# Patient Record
Sex: Male | Born: 1985 | Race: Black or African American | Hispanic: No | Marital: Single | Smoking: Current every day smoker
Health system: Southern US, Community
[De-identification: ages and names within clinical notes are randomized; demographics above are authoritative.]

## PROBLEM LIST (undated history)

## (undated) ENCOUNTER — Ambulatory Visit (HOSPITAL_COMMUNITY): Disposition: A | Discharge status: Home / Self Care | Payer: Self-pay

## (undated) DIAGNOSIS — F1210 Cannabis abuse, uncomplicated: Secondary | ICD-10-CM

## (undated) DIAGNOSIS — K2920 Alcoholic gastritis without bleeding: Secondary | ICD-10-CM

## (undated) DIAGNOSIS — F1010 Alcohol abuse, uncomplicated: Secondary | ICD-10-CM

---

## 2010-02-08 ENCOUNTER — Emergency Department (HOSPITAL_COMMUNITY): Admission: EM | Admit: 2010-02-08 | Discharge: 2010-02-08 | Payer: Self-pay | Admitting: Emergency Medicine

## 2011-02-27 ENCOUNTER — Emergency Department (HOSPITAL_COMMUNITY)
Admission: EM | Admit: 2011-02-27 | Discharge: 2011-02-27 | Disposition: A | Discharge status: Home / Self Care | Payer: Self-pay | Attending: Emergency Medicine | Admitting: Emergency Medicine

## 2011-02-27 ENCOUNTER — Emergency Department (HOSPITAL_COMMUNITY): Payer: Self-pay

## 2011-02-27 DIAGNOSIS — S6990XA Unspecified injury of unspecified wrist, hand and finger(s), initial encounter: Secondary | ICD-10-CM | POA: Insufficient documentation

## 2011-02-27 DIAGNOSIS — M7989 Other specified soft tissue disorders: Secondary | ICD-10-CM | POA: Insufficient documentation

## 2011-02-27 DIAGNOSIS — IMO0002 Reserved for concepts with insufficient information to code with codable children: Secondary | ICD-10-CM | POA: Insufficient documentation

## 2011-02-27 DIAGNOSIS — M79609 Pain in unspecified limb: Secondary | ICD-10-CM | POA: Insufficient documentation

## 2011-02-27 DIAGNOSIS — S60229A Contusion of unspecified hand, initial encounter: Secondary | ICD-10-CM | POA: Insufficient documentation

## 2013-01-14 ENCOUNTER — Encounter (HOSPITAL_COMMUNITY): Payer: Self-pay | Admitting: Emergency Medicine

## 2013-01-14 ENCOUNTER — Emergency Department (INDEPENDENT_AMBULATORY_CARE_PROVIDER_SITE_OTHER)
Admission: EM | Admit: 2013-01-14 | Discharge: 2013-01-14 | Disposition: A | Discharge status: Home / Self Care | Payer: Self-pay | Source: Home / Self Care | Attending: Family Medicine | Admitting: Family Medicine

## 2013-01-14 ENCOUNTER — Other Ambulatory Visit (HOSPITAL_COMMUNITY)
Admission: RE | Admit: 2013-01-14 | Discharge: 2013-01-14 | Disposition: A | Discharge status: Home / Self Care | Payer: Self-pay | Source: Ambulatory Visit | Attending: Family Medicine | Admitting: Family Medicine

## 2013-01-14 DIAGNOSIS — Z113 Encounter for screening for infections with a predominantly sexual mode of transmission: Secondary | ICD-10-CM | POA: Insufficient documentation

## 2013-01-14 DIAGNOSIS — Z202 Contact with and (suspected) exposure to infections with a predominantly sexual mode of transmission: Secondary | ICD-10-CM

## 2013-01-14 DIAGNOSIS — Z2089 Contact with and (suspected) exposure to other communicable diseases: Secondary | ICD-10-CM

## 2013-01-14 LAB — POCT URINALYSIS DIP (DEVICE)
Hgb urine dipstick: NEGATIVE
Nitrite: NEGATIVE
Protein, ur: NEGATIVE mg/dL
Specific Gravity, Urine: 1.025 (ref 1.005–1.030)
Urobilinogen, UA: 2 mg/dL — ABNORMAL HIGH (ref 0.0–1.0)

## 2013-01-14 MED ORDER — LIDOCAINE HCL (PF) 1 % IJ SOLN
INTRAMUSCULAR | Status: AC
  Filled 2013-01-14: qty 5

## 2013-01-14 MED ORDER — CEFTRIAXONE SODIUM 1 G IJ SOLR
INTRAMUSCULAR | Status: AC
  Filled 2013-01-14: qty 10

## 2013-01-14 MED ORDER — AZITHROMYCIN 250 MG PO TABS
ORAL_TABLET | ORAL | Status: AC
  Filled 2013-01-14: qty 4

## 2013-01-14 MED ORDER — CEFTRIAXONE SODIUM 250 MG IJ SOLR
250.0000 mg | Freq: Once | INTRAMUSCULAR | Status: AC
  Administered 2013-01-14: 250 mg via INTRAMUSCULAR

## 2013-01-14 MED ORDER — AZITHROMYCIN 250 MG PO TABS
1000.0000 mg | ORAL_TABLET | Freq: Once | ORAL | Status: AC
  Administered 2013-01-14: 1000 mg via ORAL

## 2013-01-14 NOTE — ED Notes (Signed)
Pt c/o of his partner testing positive for Chlamydia last week. Denies burning with urination or discharge. Patient is alert and oriented.

## 2013-01-14 NOTE — ED Provider Notes (Signed)
History     CSN: [id number]  Arrival date & time 01/14/13  1135   First MD Initiated Contact with Patient 01/14/13 1311      Chief Complaint  Patient presents with  . Exposure to STD    (Consider location/radiation/quality/duration/timing/severity/associated sxs/prior treatment) HPI Comments: 27 year old male with no significant past medical history. Here concerned about exposure to sexually transmitted disease. Patient reports that he's pregnant girlfriend was diagnosed with an STD recently they think it was Chlamydia. She was treated and was asked to tell her partner to be checked. Patient denies current dysuria or peneal discharge  But states he might had these symptoms recently. After discussion he would like to be treated both for gonorrhea and chlamydia empirically if possible today.   History reviewed. No pertinent past medical history.  History reviewed. No pertinent past surgical history.  History reviewed. No pertinent family history.  History  Substance Use Topics  . Smoking status: Current Every Day Smoker -- 0.25 packs/day    Types: Cigarettes  . Smokeless tobacco: Not on file  . Alcohol Use: 1.2 oz/week    2 Cans of beer per week      Review of Systems  Constitutional: Negative for fever, chills and diaphoresis.  Genitourinary: Negative for dysuria, frequency, hematuria, flank pain, discharge and testicular pain.  Skin: Negative for rash.    Allergies  Review of patient's allergies indicates no known allergies.  Home Medications  No current outpatient prescriptions on file.  BP 134/88  Pulse 66  Temp(Src) 98.2 F (36.8 C) (Oral)  Resp 18  SpO2 99%  Physical Exam  Nursing note and vitals reviewed. Constitutional: He is oriented to person, place, and time. He appears well-developed and well-nourished. No distress.  HENT:  Head: Normocephalic and atraumatic.  Mouth/Throat: No oropharyngeal exudate.  Eyes: No scleral icterus.  Cardiovascular:  Normal heart sounds.   Pulmonary/Chest: Breath sounds normal.  Abdominal:  No CVT  Genitourinary:  Deferred genital exam.  Lymphadenopathy:    He has no cervical adenopathy.  Neurological: He is alert and oriented to person, place, and time.  Skin: No rash noted. He is not diaphoretic.    ED Course  Procedures (including critical care time)  Labs Reviewed  POCT URINALYSIS DIP (DEVICE) - Abnormal; Notable for the following:    Bilirubin Urine SMALL (*)    Ketones, ur TRACE (*)    Urobilinogen, UA 2.0 (*)    Leukocytes, UA SMALL (*)    All other components within normal limits  URINE CYTOLOGY ANCILLARY ONLY   No results found.   1. Exposure to STD       MDM  Patient was treated empirically for gonorrhea and Chlamydia was given Rocephin and 250 mg IM x1 and a 6 minus and 1 g oral x1. GC gonorrhea and trichomonas test result pending.        Sharin Grave, MD 01/16/13 [phone]

## 2013-01-19 ENCOUNTER — Telehealth (HOSPITAL_COMMUNITY): Payer: Self-pay | Admitting: *Deleted

## 2013-01-19 NOTE — ED Notes (Signed)
GC neg., Chlamydia pos., Trich neg.  Pt. adequately treated with Zithromax and Rocephin.  I called pt.  Pt. verified x 2 and given results.  Pt. told he was adequately treated.  Pt. instructed to notify his partner, no sex for 1 week and to practice safe sex. Pt. told he can get HIV testing at the [facility]. STD clinic, by appointment.  DHHS form completed and faxed to the [facility] Department. Vassie Moselle 01/19/2013

## 2016-08-13 DIAGNOSIS — K2920 Alcoholic gastritis without bleeding: Secondary | ICD-10-CM

## 2016-08-13 HISTORY — DX: Alcoholic gastritis without bleeding: K29.20

## 2016-08-16 ENCOUNTER — Encounter (HOSPITAL_COMMUNITY): Payer: Self-pay | Admitting: *Deleted

## 2016-08-16 DIAGNOSIS — K2920 Alcoholic gastritis without bleeding: Secondary | ICD-10-CM | POA: Insufficient documentation

## 2016-08-16 DIAGNOSIS — F17210 Nicotine dependence, cigarettes, uncomplicated: Secondary | ICD-10-CM | POA: Insufficient documentation

## 2016-08-16 MED ORDER — ONDANSETRON 4 MG PO TBDP
ORAL_TABLET | ORAL | Status: DC
Start: 2016-08-16 — End: 2016-08-17
  Filled 2016-08-16: qty 1

## 2016-08-16 MED ORDER — ONDANSETRON 4 MG PO TBDP
4.0000 mg | ORAL_TABLET | Freq: Once | ORAL | Status: AC | PRN
  Administered 2016-08-16: 4 mg via ORAL

## 2016-08-16 NOTE — ED Triage Notes (Signed)
Pt c/o L sided abdominal pain after vomiting onset today. Pt thinks he may have alcohol poisioning

## 2016-08-17 ENCOUNTER — Emergency Department (HOSPITAL_COMMUNITY)
Admission: EM | Admit: 2016-08-17 | Discharge: 2016-08-17 | Disposition: A | Discharge status: Home / Self Care | Payer: Self-pay | Attending: Emergency Medicine | Admitting: Emergency Medicine

## 2016-08-17 DIAGNOSIS — K2920 Alcoholic gastritis without bleeding: Secondary | ICD-10-CM

## 2016-08-17 LAB — URINE MICROSCOPIC-ADD ON: RBC / HPF: NONE SEEN RBC/hpf (ref 0–5)

## 2016-08-17 LAB — URINALYSIS, ROUTINE W REFLEX MICROSCOPIC
BILIRUBIN URINE: NEGATIVE
Glucose, UA: NEGATIVE mg/dL
Hgb urine dipstick: NEGATIVE
KETONES UR: NEGATIVE mg/dL
NITRITE: NEGATIVE
PROTEIN: 30 mg/dL — AB
Specific Gravity, Urine: 1.017 (ref 1.005–1.030)
pH: 5.5 (ref 5.0–8.0)

## 2016-08-17 LAB — COMPREHENSIVE METABOLIC PANEL
ALT: 47 U/L (ref 17–63)
ANION GAP: 14 (ref 5–15)
AST: 44 U/L — AB (ref 15–41)
Albumin: 4.6 g/dL (ref 3.5–5.0)
Alkaline Phosphatase: 62 U/L (ref 38–126)
BUN: 7 mg/dL (ref 6–20)
CHLORIDE: 103 mmol/L (ref 101–111)
CO2: 23 mmol/L (ref 22–32)
Calcium: 8.8 mg/dL — ABNORMAL LOW (ref 8.9–10.3)
Creatinine, Ser: 0.81 mg/dL (ref 0.61–1.24)
Glucose, Bld: 86 mg/dL (ref 65–99)
POTASSIUM: 3.5 mmol/L (ref 3.5–5.1)
Sodium: 140 mmol/L (ref 135–145)
TOTAL PROTEIN: 8.2 g/dL — AB (ref 6.5–8.1)
Total Bilirubin: 0.4 mg/dL (ref 0.3–1.2)

## 2016-08-17 LAB — CBC
HCT: 41.3 % (ref 39.0–52.0)
Hemoglobin: 14 g/dL (ref 13.0–17.0)
MCH: 31 pg (ref 26.0–34.0)
MCHC: 33.9 g/dL (ref 30.0–36.0)
MCV: 91.6 fL (ref 78.0–100.0)
PLATELETS: 349 10*3/uL (ref 150–400)
RBC: 4.51 MIL/uL (ref 4.22–5.81)
RDW: 14 % (ref 11.5–15.5)
WBC: 4.4 10*3/uL (ref 4.0–10.5)

## 2016-08-17 LAB — CBG MONITORING, ED: GLUCOSE-CAPILLARY: 84 mg/dL (ref 65–99)

## 2016-08-17 LAB — LIPASE, BLOOD: LIPASE: 26 U/L (ref 11–51)

## 2016-08-17 MED ORDER — PANTOPRAZOLE SODIUM 40 MG IV SOLR
INTRAVENOUS | Status: AC
  Administered 2016-08-17: 02:00:00
  Filled 2016-08-17: qty 40

## 2016-08-17 MED ORDER — OMEPRAZOLE 20 MG PO CPDR: 20.0000 mg | DELAYED_RELEASE_CAPSULE | Freq: Two times a day (BID) | ORAL | 0 refills | Status: DC

## 2016-08-17 MED ORDER — SODIUM CHLORIDE 0.9 % IV BOLUS (SEPSIS)
1000.0000 mL | Freq: Once | INTRAVENOUS | Status: AC
  Administered 2016-08-17: 1000 mL via INTRAVENOUS

## 2016-08-17 MED ORDER — PROMETHAZINE HCL 25 MG/ML IJ SOLN
12.5000 mg | Freq: Once | INTRAMUSCULAR | Status: AC
Start: 2016-08-17 — End: 2016-08-17
  Administered 2016-08-17: 12.5 mg via INTRAVENOUS
  Filled 2016-08-17: qty 1

## 2016-08-17 MED ORDER — PROMETHAZINE HCL 25 MG PO TABS: 25.0000 mg | ORAL_TABLET | Freq: Four times a day (QID) | ORAL | 0 refills | Status: DC | PRN

## 2016-08-17 NOTE — ED Notes (Signed)
Pt departed in NAD.  

## 2016-08-17 NOTE — ED Provider Notes (Signed)
[facility]-EMERGENCY DEPT Provider Note   CSN: [id number] Arrival date & time: 08/16/16  2332     History   Chief Complaint Chief Complaint  Patient presents with  . Emesis    HPI Seth Nolan is a 30 y.o. male.  Patient without significant medical history presents with nausea and vomiting today, starting around noon. He has seen a small amount of blood in his emesis. No diarrhea, fever. He complains of LUQ abdominal pain. He reports he has been drinking alcohol for the past 2-3 days and feels this may be related. No chest pain, SOB or cough.   The history is provided by the patient and a parent. No language interpreter was used.  Emesis   Associated symptoms include abdominal pain. Pertinent negatives include no chills, no diarrhea and no fever.    History reviewed. No pertinent past medical history.  There are no active problems to display for this patient.   History reviewed. No pertinent surgical history.     Home Medications    Prior to Admission medications   Not on File    Family History No family history on file.  Social History Social History  Substance Use Topics  . Smoking status: Current Every Day Smoker    Packs/day: 0.25    Types: Cigarettes  . Smokeless tobacco: Never Used  . Alcohol use 1.2 oz/week    2 Cans of beer per week     Allergies   Patient has no known allergies.   Review of Systems Review of Systems  Constitutional: Negative for chills and fever.  HENT: Negative.   Respiratory: Negative.  Negative for shortness of breath.   Cardiovascular: Negative.  Negative for chest pain.  Gastrointestinal: Positive for abdominal pain, nausea and vomiting. Negative for diarrhea.  Musculoskeletal: Negative.   Skin: Negative.   Neurological: Negative.  Negative for light-headedness.     Physical Exam Updated Vital Signs BP 131/82 (BP Location: Left Arm)   Pulse 76   Temp 97.9 F (36.6 C) (Oral)   Resp 18   SpO2 97%   Physical  Exam  Constitutional: He is oriented to person, place, and time. He appears well-developed and well-nourished.  HENT:  Head: Normocephalic.  Mouth/Throat: Mucous membranes are dry.  Neck: Normal range of motion. Neck supple.  Cardiovascular: Normal rate and regular rhythm.   No murmur heard. Pulmonary/Chest: Effort normal and breath sounds normal. He has no wheezes. He has no rales.  Abdominal: Soft. There is tenderness (Epigastric and LUQ tenderness. No guarding.). There is no rebound and no guarding.  Musculoskeletal: Normal range of motion.  Neurological: He is alert and oriented to person, place, and time.  Skin: Skin is warm and dry. No rash noted.  Psychiatric: He has a normal mood and affect.     ED Treatments / Results  Labs (all labs ordered are listed, but only abnormal results are displayed) Labs Reviewed  URINALYSIS, ROUTINE W REFLEX MICROSCOPIC (NOT AT [facility]) - Abnormal; Notable for the following:       Result Value   APPearance CLOUDY (*)    Protein, ur 30 (*)    Leukocytes, UA TRACE (*)    All other components within normal limits  URINE MICROSCOPIC-ADD ON - Abnormal; Notable for the following:    Squamous Epithelial / LPF 0-5 (*)    Bacteria, UA RARE (*)    Casts HYALINE CASTS (*)    All other components within normal limits  CBC  LIPASE, BLOOD  COMPREHENSIVE  METABOLIC PANEL  CBG MONITORING, ED    EKG  EKG Interpretation None       Radiology No results found.  Procedures Procedures (including critical care time)  Medications Ordered in ED Medications  ondansetron (ZOFRAN-ODT) 4 MG disintegrating tablet (not administered)  sodium chloride 0.9 % bolus 1,000 mL (not administered)  promethazine (PHENERGAN) injection 12.5 mg (not administered)  pantoprazole (PROTONIX) 40 MG injection (not administered)  ondansetron (ZOFRAN-ODT) disintegrating tablet 4 mg (4 mg Oral Given 08/16/16 2351)     Initial Impression / Assessment and Plan / ED Course  I  have reviewed the triage vital signs and the nursing notes.  Pertinent labs & imaging results that were available during my care of the patient were reviewed by me and considered in my medical decision making (see chart for details).  Clinical Course     Patient presents after 2-3 days heavy alcohol use with nausea and vomiting. He continues have vomiting in the ED despite Zofran.  IVF's bolused, IV Phenergan provided. He is resting on re-evaluation without further nausea. Normal hemoglobin.   Lab delay due to failed chemistry spectrometer. Labs being sent out. Will continue to observe.  Labs reassuring. Negative for evidence of pancreatitis. Feel symptoms are due to alcohol gastritis without hemorrhage. Will start on Prilosec, and provide resources for alcohol abuse counseling.  Final Clinical Impressions(s) / ED Diagnoses   Final diagnoses:  None   1. Alcohol gastritis   New Prescriptions New Prescriptions   No medications on file     Elpidio AnisShari Liston Thum, PA-C 08/17/16 0355    Gilda Creasehristopher J Pollina, MD 08/17/16 [phone]

## 2016-08-17 NOTE — ED Notes (Signed)
2nd NS bolus completed. Pt sleeping, but family at bedside reports no additional episodes of emesis since receiving medication. Pt asked for and was given water to drink.

## 2017-07-04 ENCOUNTER — Encounter (HOSPITAL_COMMUNITY): Payer: Self-pay | Admitting: Emergency Medicine

## 2017-07-04 ENCOUNTER — Emergency Department (HOSPITAL_COMMUNITY)
Admission: EM | Admit: 2017-07-04 | Discharge: 2017-07-06 | Disposition: A | Discharge status: Other Acute Inpatient Hospital | Payer: Self-pay | Attending: Emergency Medicine | Admitting: Emergency Medicine

## 2017-07-04 DIAGNOSIS — F17210 Nicotine dependence, cigarettes, uncomplicated: Secondary | ICD-10-CM | POA: Insufficient documentation

## 2017-07-04 DIAGNOSIS — F1210 Cannabis abuse, uncomplicated: Secondary | ICD-10-CM | POA: Insufficient documentation

## 2017-07-04 DIAGNOSIS — F10920 Alcohol use, unspecified with intoxication, uncomplicated: Secondary | ICD-10-CM | POA: Insufficient documentation

## 2017-07-04 DIAGNOSIS — F333 Major depressive disorder, recurrent, severe with psychotic symptoms: Secondary | ICD-10-CM | POA: Insufficient documentation

## 2017-07-04 DIAGNOSIS — R45851 Suicidal ideations: Secondary | ICD-10-CM | POA: Insufficient documentation

## 2017-07-04 HISTORY — DX: Cannabis abuse, uncomplicated: F12.10

## 2017-07-04 HISTORY — DX: Alcoholic gastritis without bleeding: K29.20

## 2017-07-04 HISTORY — DX: Alcohol abuse, uncomplicated: F10.10

## 2017-07-04 LAB — CBC
HEMATOCRIT: 41.8 % (ref 39.0–52.0)
HEMOGLOBIN: 14 g/dL (ref 13.0–17.0)
MCH: 31.3 pg (ref 26.0–34.0)
MCHC: 33.5 g/dL (ref 30.0–36.0)
MCV: 93.3 fL (ref 78.0–100.0)
Platelets: 318 10*3/uL (ref 150–400)
RBC: 4.48 MIL/uL (ref 4.22–5.81)
RDW: 13.4 % (ref 11.5–15.5)
WBC: 5.1 10*3/uL (ref 4.0–10.5)

## 2017-07-04 LAB — RAPID URINE DRUG SCREEN, HOSP PERFORMED
Amphetamines: NOT DETECTED
BARBITURATES: NOT DETECTED
Benzodiazepines: NOT DETECTED
Cocaine: NOT DETECTED
Opiates: NOT DETECTED
TETRAHYDROCANNABINOL: POSITIVE — AB

## 2017-07-04 LAB — COMPREHENSIVE METABOLIC PANEL
ALBUMIN: 4.5 g/dL (ref 3.5–5.0)
ALK PHOS: 54 U/L (ref 38–126)
ALT: 15 U/L — AB (ref 17–63)
AST: 26 U/L (ref 15–41)
Anion gap: 11 (ref 5–15)
BUN: 7 mg/dL (ref 6–20)
CALCIUM: 8.7 mg/dL — AB (ref 8.9–10.3)
CHLORIDE: 107 mmol/L (ref 101–111)
CO2: 24 mmol/L (ref 22–32)
CREATININE: 0.99 mg/dL (ref 0.61–1.24)
GFR calc Af Amer: >60 mL/min (ref >60)
GFR calc non Af Amer: >60 mL/min (ref >60)
GLUCOSE: 92 mg/dL (ref 65–99)
Potassium: 3.5 mmol/L (ref 3.5–5.1)
SODIUM: 142 mmol/L (ref 135–145)
Total Bilirubin: 0.5 mg/dL (ref 0.3–1.2)
Total Protein: 7.7 g/dL (ref 6.5–8.1)

## 2017-07-04 LAB — ACETAMINOPHEN LEVEL

## 2017-07-04 LAB — ETHANOL: Alcohol, Ethyl (B): 393 mg/dL (ref <5)

## 2017-07-04 LAB — SALICYLATE LEVEL: Salicylate Lvl: <7 mg/dL (ref 2.8–30.0)

## 2017-07-04 MED ORDER — THIAMINE HCL 100 MG/ML IJ SOLN: 100.0000 mg | Freq: Every day | INTRAMUSCULAR | Status: DC

## 2017-07-04 MED ORDER — LORAZEPAM 1 MG PO TABS
0.0000 mg | ORAL_TABLET | Freq: Four times a day (QID) | ORAL | Status: DC
  Administered 2017-07-05 (×2): 1 mg via ORAL
  Filled 2017-07-04 (×2): qty 1

## 2017-07-04 MED ORDER — LORAZEPAM 1 MG PO TABS: 0.0000 mg | ORAL_TABLET | Freq: Two times a day (BID) | ORAL | Status: DC

## 2017-07-04 MED ORDER — LORAZEPAM 2 MG/ML IJ SOLN: 0.0000 mg | Freq: Four times a day (QID) | INTRAMUSCULAR | Status: DC

## 2017-07-04 MED ORDER — VITAMIN B-1 100 MG PO TABS
100.0000 mg | ORAL_TABLET | Freq: Every day | ORAL | Status: DC
  Administered 2017-07-05 – 2017-07-06 (×2): 100 mg via ORAL
  Filled 2017-07-04 (×2): qty 1

## 2017-07-04 MED ORDER — LORAZEPAM 2 MG/ML IJ SOLN: 0.0000 mg | Freq: Two times a day (BID) | INTRAMUSCULAR | Status: DC

## 2017-07-04 NOTE — ED Provider Notes (Signed)
[facility] Provider Note   CSN: [id number] Arrival date & time: 07/04/17  1948    History   Chief Complaint Chief Complaint  Patient presents with  . Suicidal    HPI Seth Nolan is a 31 y.o. male.  31 year old male with no significant past medical history presents to the emergency department for suicidal ideations. Patient states he has been having plans of killing himself by drinking himself to death. He states that he has been depressed, "going through it". He has been advised to seek assistance from a counselor or therapist in the past. He denies a history of behavioral health hospitalizations. He is not currently on psychiatric medication. He states that his girlfriend left and took his daughter which prompted his worsening depression. He has been drinking alcohol today, binge drinking over the last few days. No history of seizures from alcohol withdrawal. Only illicit drug use is significant for marijuana. No homicidal ideations.   The history is provided by the patient. No language interpreter was used.    History reviewed. No pertinent past medical history.  There are no active problems to display for this patient.   History reviewed. No pertinent surgical history.     Home Medications    Prior to Admission medications   Medication Sig Start Date End Date Taking? Authorizing Provider  omeprazole (PRILOSEC) 20 MG capsule Take 1 capsule (20 mg total) by mouth 2 (two) times daily before a meal. Patient not taking: Reported on 07/04/2017 08/17/16   Elpidio Anis, PA-C  promethazine (PHENERGAN) 25 MG tablet Take 1 tablet (25 mg total) by mouth every 6 (six) hours as needed for nausea or vomiting. Patient not taking: Reported on 07/04/2017 08/17/16   Elpidio Anis, PA-C    Family History No family history on file.  Social History Social History  Substance Use Topics  . Smoking status: Current Every Day Smoker    Packs/day: 0.25    Types: Cigarettes  .  Smokeless tobacco: Never Used  . Alcohol use 1.2 oz/week    2 Cans of beer per week     Allergies   Patient has no known allergies.   Review of Systems Review of Systems Ten systems reviewed and are negative for acute change, except as noted in the HPI.    Physical Exam Updated Vital Signs BP 124/66   Pulse 74   Temp 98.2 F (36.8 C) (Oral)   Resp 16   SpO2 98%   Physical Exam  Constitutional: He is oriented to person, place, and time. He appears well-developed and well-nourished. No distress.  Nontoxic and in NAD  HENT:  Head: Normocephalic and atraumatic.  Eyes: Conjunctivae and EOM are normal. No scleral icterus.  Neck: Normal range of motion.  Cardiovascular: Normal rate, regular rhythm and intact distal pulses.   Pulmonary/Chest: Effort normal. No respiratory distress.  Respirations even and unlabored. Lungs CTAB.  Musculoskeletal: Normal range of motion.  Neurological: He is alert and oriented to person, place, and time. He exhibits normal muscle tone. Coordination normal.  GCS 15. Speech mildly slurred, but goal oriented with logical thoughts. Patient ambulatory with steady gait.  Skin: Skin is warm and dry. No rash noted. He is not diaphoretic. No erythema. No pallor.  Psychiatric: His behavior is normal. His speech is slurred (minimal). He exhibits a depressed mood. He expresses suicidal ideation. He expresses no homicidal ideation. He expresses no homicidal plans.  Nursing note and vitals reviewed.    ED Treatments / Results  Labs (  all labs ordered are listed, but only abnormal results are displayed) Labs Reviewed  COMPREHENSIVE METABOLIC PANEL - Abnormal; Notable for the following:       Result Value   Calcium 8.7 (*)    ALT 15 (*)    All other components within normal limits  ETHANOL - Abnormal; Notable for the following:    Alcohol, Ethyl (B) 393 (*)    All other components within normal limits  ACETAMINOPHEN LEVEL - Abnormal; Notable for the  following:    Acetaminophen (Tylenol), Serum <10 (*)    All other components within normal limits  RAPID URINE DRUG SCREEN, HOSP PERFORMED - Abnormal; Notable for the following:    Tetrahydrocannabinol POSITIVE (*)    All other components within normal limits  SALICYLATE LEVEL  CBC    EKG  EKG Interpretation None       Radiology No results found.  Procedures Procedures (including critical care time)  Medications Ordered in ED Medications  LORazepam (ATIVAN) injection 0-4 mg (0 mg Intravenous Not Given 07/05/17 0122)    Or  LORazepam (ATIVAN) tablet 0-4 mg ( Oral See Alternative 07/05/17 0122)  LORazepam (ATIVAN) injection 0-4 mg (not administered)    Or  LORazepam (ATIVAN) tablet 0-4 mg (not administered)  thiamine (VITAMIN B-1) tablet 100 mg (not administered)    Or  thiamine (B-1) injection 100 mg (not administered)     Initial Impression / Assessment and Plan / ED Course  I have reviewed the triage vital signs and the nursing notes.  Pertinent labs & imaging results that were available during my care of the patient were reviewed by me and considered in my medical decision making (see chart for details).     Patient presenting for suicidal ideations. He was intoxicated on arrival. The patient has been medically cleared and evaluated by TTS. They recommend further inpatient treatment for major depressive disorder. No beds available at behavioral health. TTS to seek placement.   Final Clinical Impressions(s) / ED Diagnoses   Final diagnoses:  Severe episode of recurrent major depressive disorder, with psychotic features (HCC)  Alcoholic intoxication without complication [facility])    New Prescriptions New Prescriptions   No medications on file     Antony Madura, PA-C 07/05/17 0539    Vanetta Mulders, MD 07/08/17 [phone]

## 2017-07-04 NOTE — ED Triage Notes (Signed)
Pt tearful in triage, states he is depressed, "going through it", but needs help. States he is feeling suicidal, no plan at this time. PT states his girlfriend left and took his daughter and he "doesn't want to go through it anymore". States he has been drinking alcohol today.

## 2017-07-04 NOTE — ED Notes (Signed)
Staffing office notified for sitter need.

## 2017-07-05 ENCOUNTER — Encounter (HOSPITAL_COMMUNITY): Payer: Self-pay | Admitting: *Deleted

## 2017-07-05 NOTE — ED Notes (Signed)
Patient does not have a sitter at this time as staffing nor the ED can cover.  Pt is in room F11, which has a door and a window, each with blinds.  Door is shut to block out light for patient comfort.  Window facing nurses' station has blinds open for patient observation.

## 2017-07-05 NOTE — ED Notes (Signed)
Pt aware meal tray on bedside table. States will eat later.

## 2017-07-05 NOTE — [facility] Counselor (Signed)
Per Nira Conn, NP: Patient meets criteria for inpatient treatment.  Per [facility] for [facility], Binnie Rail, RN, no appropriate beds available at this time.   Attending Provider, Antony Madura, PA-C, notified at [phone].

## 2017-07-05 NOTE — ED Notes (Signed)
Woke pt to advise breakfast tray on bedside table - pt acknowledged and then returned to sleeping.

## 2017-07-05 NOTE — Progress Notes (Signed)
Patient has been recommended inpatient treatment and referred to the following facilities: [facility], [facility], [facility], [facility], and [facility].  Patient is being reviewed at [facility].  CSW in disposition will continue to follow up with placement efforts.  Melbourne Abts, MSW, LCSWA Clinical social worker in disposition [facility], TTS Office [phone] and [phone] 07/05/2017 3:57 PM
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
## 2017-07-05 NOTE — ED Notes (Signed)
States "I feel OK right now. I don't think I need any medicine".

## 2017-07-05 NOTE — ED Notes (Signed)
Sitter arrived

## 2017-07-05 NOTE — Progress Notes (Signed)
Patient is being reviewed at [facility]. Please re-check patient's new vitals and CIWA, per [facility] at [facility]. [facility] RN Ledon Snare was informed.  Melbourne Abts, MSW, LCSWA Clinical social worker in disposition [facility], TTS Office

## 2017-07-05 NOTE — ED Notes (Signed)
Plan of care discussed with patient.  Patient's mom also aware.  Patient's mom TOOK ALL OF PATIENT'S BELONGINGS (except his sandals) with her.  Patient resting in bed, lights dimmed for comfort.

## 2017-07-05 NOTE — ED Notes (Signed)
Asked patient if he was experiencing any pain.  Pt states "yeah, in my head".  This RN asked him to rate it 0-10.  Patient states "there's no pain skill for this kind of pain".  This RN asked him to clarify.  Pt states  "I can hear him talking, and it hurts my head".  This RN clarified with patient and he states he hears a voice that "I just try to ignore".  Denies command hallucination.  When asked what would help this kind of headache patient states "Do you want the truth?  A ton of alcohol".  This RN reassured him and at that time the TTS machine rang for patient assessment.

## 2017-07-05 NOTE — ED Notes (Signed)
Pt states he is not hungry - only thirsty. Apple juice x 4 given as requested.

## 2017-07-06 ENCOUNTER — Inpatient Hospital Stay (HOSPITAL_COMMUNITY)
Admission: AD | Admit: 2017-07-06 | Discharge: 2017-07-10 | DRG: 897 | Disposition: A | Discharge status: Home / Self Care | Payer: Federal, State, Local not specified - Other | Source: Intra-hospital | Attending: Psychiatry | Admitting: Psychiatry

## 2017-07-06 ENCOUNTER — Encounter (HOSPITAL_COMMUNITY): Payer: Self-pay | Admitting: *Deleted

## 2017-07-06 DIAGNOSIS — Y908 Blood alcohol level of 240 mg/100 ml or more: Secondary | ICD-10-CM | POA: Diagnosis present

## 2017-07-06 DIAGNOSIS — K2920 Alcoholic gastritis without bleeding: Secondary | ICD-10-CM | POA: Diagnosis present

## 2017-07-06 DIAGNOSIS — F17210 Nicotine dependence, cigarettes, uncomplicated: Secondary | ICD-10-CM | POA: Diagnosis not present

## 2017-07-06 DIAGNOSIS — F419 Anxiety disorder, unspecified: Secondary | ICD-10-CM | POA: Diagnosis present

## 2017-07-06 DIAGNOSIS — G4700 Insomnia, unspecified: Secondary | ICD-10-CM | POA: Diagnosis present

## 2017-07-06 DIAGNOSIS — F10230 Alcohol dependence with withdrawal, uncomplicated: Secondary | ICD-10-CM | POA: Diagnosis present

## 2017-07-06 DIAGNOSIS — F10220 Alcohol dependence with intoxication, uncomplicated: Secondary | ICD-10-CM | POA: Diagnosis present

## 2017-07-06 DIAGNOSIS — F12120 Cannabis abuse with intoxication, uncomplicated: Secondary | ICD-10-CM | POA: Diagnosis present

## 2017-07-06 DIAGNOSIS — F333 Major depressive disorder, recurrent, severe with psychotic symptoms: Secondary | ICD-10-CM | POA: Diagnosis present

## 2017-07-06 DIAGNOSIS — R45850 Homicidal ideations: Secondary | ICD-10-CM | POA: Diagnosis not present

## 2017-07-06 DIAGNOSIS — F1994 Other psychoactive substance use, unspecified with psychoactive substance-induced mood disorder: Principal | ICD-10-CM

## 2017-07-06 DIAGNOSIS — Z79899 Other long term (current) drug therapy: Secondary | ICD-10-CM

## 2017-07-06 DIAGNOSIS — R45851 Suicidal ideations: Secondary | ICD-10-CM | POA: Diagnosis not present

## 2017-07-06 MED ORDER — ALUM & MAG HYDROXIDE-SIMETH 200-200-20 MG/5ML PO SUSP: 30.0000 mL | ORAL | Status: DC | PRN

## 2017-07-06 MED ORDER — TRAZODONE HCL 50 MG PO TABS
50.0000 mg | ORAL_TABLET | Freq: Every evening | ORAL | Status: DC | PRN
  Administered 2017-07-06 – 2017-07-09 (×4): 50 mg via ORAL
  Filled 2017-07-06 (×2): qty 1
  Filled 2017-07-06: qty 7
  Filled 2017-07-06 (×2): qty 1

## 2017-07-06 MED ORDER — MAGNESIUM HYDROXIDE 400 MG/5ML PO SUSP: 30.0000 mL | Freq: Every day | ORAL | Status: DC | PRN

## 2017-07-06 MED ORDER — ACETAMINOPHEN 325 MG PO TABS: 650.0000 mg | ORAL_TABLET | Freq: Four times a day (QID) | ORAL | Status: DC | PRN

## 2017-07-06 MED ORDER — HYDROXYZINE HCL 25 MG PO TABS
25.0000 mg | ORAL_TABLET | Freq: Four times a day (QID) | ORAL | Status: DC | PRN
  Administered 2017-07-08 – 2017-07-09 (×2): 25 mg via ORAL
  Filled 2017-07-06 (×2): qty 1
  Filled 2017-07-06: qty 10

## 2017-07-06 NOTE — Tx Team (Signed)
Initial Treatment Plan 07/06/2017 4:30 PM Seth Nolan ZOX:[id number]    PATIENT STRESSORS: Financial difficulties Marital or family conflict Substance abuse   PATIENT STRENGTHS: Ability for insight Average or above average intelligence Communication skills Motivation for treatment/growth   PATIENT IDENTIFIED PROBLEMS: At risk for suicide  Substance Abuse  "Control my habits/mood swings"  "control my drinking"               DISCHARGE CRITERIA:  Ability to meet basic life and health needs Improved stabilization in mood, thinking, and/or behavior Motivation to continue treatment in a less acute level of care Need for constant or close observation no longer present Withdrawal symptoms are absent or subacute and managed without 24-hour nursing intervention  PRELIMINARY DISCHARGE PLAN: Outpatient therapy Return to previous living arrangement Return to previous work or school arrangements  PATIENT/FAMILY INVOLVEMENT: This treatment plan has been presented to and reviewed with the patient, Seth Nolan.  The patient and family have been given the opportunity to ask questions and make suggestions.  Carleene Overlie, RN 07/06/2017, 4:30 PM

## 2017-07-06 NOTE — Progress Notes (Signed)
Per Berneice Heinrich , [facility], patient has been accepted to [facility], bed 300-2 ; Accepting provider is Nira Conn, NP; Attending provider is Dr. Jama Flavors.   Patient can arrive at now, bed is ready. Number for report is [phone].    Ermalinda Memos, RN notified.   Baldo Daub MSW, LCSWA CSW Disposition [phone]

## 2017-07-06 NOTE — ED Notes (Signed)
Pt at desk on phone. Callm and cooperative.

## 2017-07-06 NOTE — Progress Notes (Signed)
Patient denied SI and HI, contracts for safety.  Denied A/V hallucinations.  Denied pain.  Patient stated he did not need any anxiety/pain medications.  Patient went for recreation activity this afternoon. Respirations even and unlabored.   No signs/symptoms of pain/distress noted on patient's face/body movements.  Safety maintained with 15 minute checks.

## 2017-07-06 NOTE — ED Notes (Signed)
Visitors at bedside.

## 2017-07-06 NOTE — [facility] Group Notes (Signed)
[facility] Group Therapy 07/06/2017 1:15pm  Type of Therapy: Mental Health Association Presentation  Participation Level: Active  Participation Quality: Attentive  Affect: Appropriate  Cognitive: Oriented  Insight: Developing/Improving  Engagement in Therapy: Engaged  Modes of Intervention: Discussion, Education and Socialization  Summary of Progress/Problems: Mental Health Association (MHA) Speaker came to talk about his personal journey with mental health. The pt processed ways by which to relate to the speaker. MHA speaker provided handouts and educational information pertaining to groups and services offered by the MHA. Pt was engaged in speaker's presentation and was receptive to resources provided.    Seth Nolan N Smart, LCSW 07/06/2017 2:10 PM  

## 2017-07-06 NOTE — Progress Notes (Signed)
Tech/sitter walked with Pt to Pod C (shower). Shower is occupied at this time, will try again.

## 2017-07-06 NOTE — ED Notes (Signed)
[facility] contacted for transport 

## 2017-07-06 NOTE — Progress Notes (Signed)
Admission Note:  31 year old male who presents voluntary, in no acute distress, for the treatment of SI and Substance Abuse. Patient reports "I'm here because I drink a lot. I go through my drinking spells".  Patient reports daily alcohol use with "blacking out".  Patient appears anxious and depressed with withdrawal complaints of "sweating and shakes". Patient was calm and cooperative with admission process. Patient currently denies SI and contracts for safety upon admission. Patient reports, prior to admission, SI with a plan to "drink myself to death".  Patient reports AVH stating "A little man talks to me sometimes. I actually hear him a lot".  Patient identifies stressors as "Not where I'm suppose to be physically and mentally" and "relationship issues".  Patient reports increased anger with alcohol use.  Patient currently lives with his parents, girlfriend, and 18 year old daughter.  While at [facility], patient would like to "control my habits and mood swings" and "Control my drinking". Skin was assessed and found to be clear of any abnormal marks apart from multiple tattoos. Patient searched and no contraband found, POC and unit policies explained and understanding verbalized. Consents obtained. Food and fluids offered and accepted. Patient had no additional questions or concerns.

## 2017-07-07 DIAGNOSIS — Z653 Problems related to other legal circumstances: Secondary | ICD-10-CM

## 2017-07-07 DIAGNOSIS — F1290 Cannabis use, unspecified, uncomplicated: Secondary | ICD-10-CM

## 2017-07-07 DIAGNOSIS — F17210 Nicotine dependence, cigarettes, uncomplicated: Secondary | ICD-10-CM

## 2017-07-07 DIAGNOSIS — Z6379 Other stressful life events affecting family and household: Secondary | ICD-10-CM

## 2017-07-07 DIAGNOSIS — R45851 Suicidal ideations: Secondary | ICD-10-CM

## 2017-07-07 DIAGNOSIS — F1994 Other psychoactive substance use, unspecified with psychoactive substance-induced mood disorder: Principal | ICD-10-CM

## 2017-07-07 DIAGNOSIS — R45850 Homicidal ideations: Secondary | ICD-10-CM

## 2017-07-07 MED ORDER — NALTREXONE HCL 50 MG PO TABS
50.0000 mg | ORAL_TABLET | Freq: Every day | ORAL | Status: DC
  Administered 2017-07-07 – 2017-07-10 (×4): 50 mg via ORAL
  Filled 2017-07-07 (×2): qty 7
  Filled 2017-07-07 (×5): qty 1

## 2017-07-07 MED ORDER — VITAMIN B-1 100 MG PO TABS
100.0000 mg | ORAL_TABLET | Freq: Every day | ORAL | Status: DC
  Administered 2017-07-07 – 2017-07-10 (×4): 100 mg via ORAL
  Filled 2017-07-07 (×6): qty 1

## 2017-07-07 MED ORDER — FOLIC ACID 1 MG PO TABS
1.0000 mg | ORAL_TABLET | Freq: Every day | ORAL | Status: DC
  Administered 2017-07-07 – 2017-07-10 (×4): 1 mg via ORAL
  Filled 2017-07-07 (×7): qty 1

## 2017-07-07 MED ORDER — NICOTINE 21 MG/24HR TD PT24
21.0000 mg | MEDICATED_PATCH | Freq: Every day | TRANSDERMAL | Status: DC
  Administered 2017-07-07 – 2017-07-08 (×2): 21 mg via TRANSDERMAL
  Filled 2017-07-07 (×5): qty 1

## 2017-07-07 MED ORDER — CHLORDIAZEPOXIDE HCL 25 MG PO CAPS: 25.0000 mg | ORAL_CAPSULE | Freq: Three times a day (TID) | ORAL | Status: DC | PRN

## 2017-07-07 NOTE — [facility] Suicide Risk Assessment (Signed)
[facility] Admission Suicide Risk Assessment   Nursing information obtained from:  Patient Demographic factors:  Male, Access to firearms Current Mental Status:  Suicidal ideation indicated by patient, Suicide plan, Self-harm thoughts, Self-harm behaviors Loss Factors:  Loss of significant relationship, Financial problems / change in socioeconomic status Historical Factors:  Family history of mental illness or substance abuse Risk Reduction Factors:  Responsible for children under 81 years of age, Sense of responsibility to family, Employed, Living with another person, especially a relative, Positive social support  Total Time spent with patient: 30 minutes Principal Problem: Substance induced mood disorder (HCC) Diagnosis:   Patient Active Problem List   Diagnosis Date Noted  . 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 MDD (major depressive disorder), recurrent, severe, with psychosis (HCC) [F33.3] 07/06/2017   Subjective Data:  31 y.o AAM single, employed, lives with his mom and sisters. Brought to the ER by his mom. Intoxicated with alcohol and THC. BAL was 393 mg/dl. Expressed suicidal thoughts and thoughts of hurting others. Reported to have expressed thoughts of drinking himself to death. Reported to have expressed rage towards anyone who gets into his way. Routine labs was essentially normal. Suicidal and homicidal thoughts resolved when he sobered up. No evidence of depression, psychosis or mania. No past suicidal behavior. No past history of violent behavior. No family history of mental illness or suicide. Patient has some insight into his addiction. He is cooperate with care. He is processing effects his addiction could have over the years. Future oriented. Not confused. Clear minded and motivated to get better.  Continued Clinical Symptoms:  Alcohol Use Disorder Identification Test Final Score (AUDIT): 39 The "Alcohol Use Disorders Identification Test", Guidelines for  Use in Primary Care, Second Edition.  World Science writer [facility]). Score between 0-7:  no or low risk or alcohol related problems. Score between 8-15:  moderate risk of alcohol related problems. Score between 16-19:  high risk of alcohol related problems. Score 20 or above:  warrants further diagnostic evaluation for alcohol dependence and treatment.   CLINICAL FACTORS:   Alcohol/Substance Abuse/Dependencies   Musculoskeletal: Strength & Muscle Tone: within normal limits Gait & Station: normal Patient leans: N/A  Psychiatric Specialty Exam: Physical Exam  ROS  Blood pressure 129/86, pulse 68, temperature 98.3 F (36.8 C), temperature source Oral, resp. rate 16, height  (1.88 m), weight 72.6 kg (160 lb).Body mass index is 20.54 kg/m.  General Appearance: As in H&P  Eye Contact:  As in H&P  Speech:  As in H&P  Volume:  As in H&P  Mood:  As in H&P  Affect:  As in H&P  Thought Process:  As in H&P  Orientation:  As in H&P  Thought Content:  As in H&P  Suicidal Thoughts:  As in H&P  Homicidal Thoughts:  As in H&P  Memory:  As in H&P  Judgement:  As in H&P  Insight:  As in H&P  Psychomotor Activity:  As in H&P  Concentration:  As in H&P  Recall:  As in H&P  Fund of Knowledge:  As in H&P  Language:  As in H&P  Akathisia:  As in H&P  Handed:  As in H&P  AIMS (if indicated):     Assets:  As in H&P  ADL's:  As in H&P  Cognition:  As in H&P  Sleep:  Number of Hours: 5.75      COGNITIVE FEATURES THAT CONTRIBUTE TO RISK:  None    SUICIDE  RISK:   Minimal: No identifiable suicidal ideation.  Patients presenting with no risk factors but with morbid ruminations; may be classified as minimal risk based on the severity of the depressive symptoms  PLAN OF CARE:  As in H&P  I certify that inpatient services furnished can reasonably be expected to improve the patient's condition.   Georgiann Cocker, MD 07/07/2017, 11:55 AM

## 2017-07-07 NOTE — Progress Notes (Signed)
D: Patient endorses passive SI but contracts for safety and denies any HI or AVH at this time.   Patient has a pleasant mood during interaction with a flat affect.  Pt. States that this is his first admission and is, "just trying to get used to everything".  Pt. Is visualized in the dayroom interacting with staff and others on the unit.  Pt. Denies any physical complaints tonight and is inquisitive about the process and length of stay.  Pt. Took medication to assist with sleep tonight.  A: Patient given emotional support from RN. Patient encouraged to come to staff with concerns and/or questions. Patient's medication routine continued. Patient's orders and plan of care reviewed.   R: Patient remains appropriate and cooperative. Will continue to monitor patient q15 minutes for safety.

## 2017-07-07 NOTE — [facility] Counselor (Signed)
Adult Comprehensive Assessment  Patient ID: Seth Nolan, male   DOB: 03-Feb-1986, 31 y.o.   MRN: [id number]  Information Source: Information source: Patient  Current Stressors:  Educational / Learning stressors: High school education  Employment / Job issues: Pt is concerned that he may lose his job since he is in the hospital  Family Relationships: Distant relationship with his brother  Surveyor, quantity / Lack of resources (include bankruptcy): Limited income  Housing / Lack of housing: None reported- pt lives with his parents  Physical health (include injuries & life threatening diseases): None reported  Social relationships: None reported  Substance abuse: Daily alcohol use  Bereavement / Loss: None reported   Living/Environment/Situation:  Living Arrangements: Parent, Other relatives Living conditions (as described by patient or guardian): Pt lives with his parents, his girlfriend, his daughter, and his sister  How long has patient lived in current situation?: About 10 years  What is atmosphere in current home: Comfortable  Family History:  Marital status: Long term relationship Long term relationship, how long?: 7 years  What types of issues is patient dealing with in the relationship?: Pt's drinking has caused a lot of issues in the relationship  Does patient have children?: Yes How many children?: 1 (17 yo daughter ) How is patient's relationship with their children?: Pt reports having a good relaitonship with his daughter   Childhood History:  By whom was/is the patient raised?: Both parents Additional childhood history information: Pt reports having a good childhood. Pt states that his father also had a drinking problem while pt was growing up. Description of patient's relationship with caregiver when they were a child: Good relationship with his parents as a child  Patient's description of current relationship with people who raised him/her: Pt reports having a close  relationship with his parents currently  How were you disciplined when you got in trouble as a child/adolescent?: "I never really got into trouble" Does patient have siblings?: Yes Number of Siblings: 3 (2 sisters, 1 brother ) Description of patient's current relationship with siblings: Pt is close with his sisters but has a distant relationship with his brother  Did patient suffer any verbal/emotional/physical/sexual abuse as a child?: No Did patient suffer from severe childhood neglect?: No Has patient ever been sexually abused/assaulted/raped as an adolescent or adult?: No Was the patient ever a victim of a crime or a disaster?: No Witnessed domestic violence?: No Has patient been effected by domestic violence as an adult?: No  Education:  Highest grade of school patient has completed: High school graduate  Currently a student?: No Learning disability?: No  Employment/Work Situation:   Employment situation: Employed Where is patient currently employed?: [employer]  How long has patient been employed?: About a month  Patient's job has been impacted by current illness: Yes Describe how patient's job has been impacted: Pt is missing time from work due to being in the hospital  What is the longest time patient has a held a job?: 3 years  Where was the patient employed at that time?: [employer]  Has patient ever been in the [employer]?: No Has patient ever served in combat?: No Did You Receive Any Psychiatric Treatment/Services While in Equities trader?:  (NA) Are There Guns or Other Weapons in Your Home?: Yes Types of Guns/Weapons: Manufacturing systems engineer, hand guns  Are These Weapons Safely Secured?: Yes (The guns are in a safe and pt does not know the code )  Financial Resources:   Financial resources: Support  from parents / caregiver, Income from employment  Alcohol/Substance Abuse:   What has been your use of drugs/alcohol within the last 12 months?: Daily alcohol use, daily THC use   If attempted suicide, did drugs/alcohol play a role in this?: No Alcohol/Substance Abuse Treatment Hx: Denies past history Has alcohol/substance abuse ever caused legal problems?: Yes (Pending DWI charge )  Social Support System:   Patient's Community Support System: Good Describe Community Support System: Mom, dad, sister, girlfriend, daughter  Type of faith/religion: [employer]  How does patient's faith help to cope with current illness?: Pt isn't curently going to church   Leisure/Recreation:   Leisure and Hobbies: Therapist, music, going to the gym  Strengths/Needs:   What things does the patient do well?: "I don't know" In what areas does patient struggle / problems for patient: Alcohol use, being more social, spending more time with his family   Discharge Plan:   Does patient have access to transportation?: Yes (Pt's girlfriend or dad will pick him up) Will patient be returning to same living situation after discharge?: Yes Currently receiving community mental health services: No If no, would patient like referral for services when discharged?: Yes (What county?) [facility] outpatient [facility] referral needed ) Does patient have financial barriers related to discharge medications?: Yes Patient description of barriers related to discharge medications: Limited income   Summary/Recommendations:     Patient is a 31 yo male who presented to the hospital with depression and substance use. Primary triggers for admission include increased substance use. Pt reports drinking daily and states that his drinking is putting a strain on his family relationships and the relationship with his girlfriend. During the time of the assessment pt was alert and oriented, pleasant, and forthcoming with information. Pt is agreeable to continuing treatment on an outpatient basis. Pt's supports include his family and his girlfriend. Patient will benefit from crisis stabilization, medication evaluation, group therapy and  pyschoeducation, in addition to case management for discharge planning. At discharge, it is recommended that pt remain compliant with the established discharge plan and continue treatment.   Jonathon Jordan, MSW, [facility] 07/07/2017

## 2017-07-07 NOTE — Progress Notes (Signed)
D: Patient denies SI, HI or AVH. Patient has a pleasant mood with anxious/flat affect.  Pt. States that he has had a good day, appetite is good and reports he attended two groups.  Pt. Reports that he met with social worker and doctor today and is starting to develop a plan for discharge which is to return to his current living situation.  Pt. Has been visible on the unit interacting with staff and others.  A: Patient given emotional support from RN. Patient encouraged to come to staff with concerns and/or questions. Patient's medication routine continued. Patient's orders and plan of care reviewed.   R: Patient remains appropriate and cooperative. Will continue to monitor patient q15 minutes for safety.   

## 2017-07-07 NOTE — Plan of Care (Signed)
Problem: Education: Goal: Mental status will improve Outcome: Progressing Nurse discussed depression/anxiety/coping skills with patient.    

## 2017-07-07 NOTE — Progress Notes (Signed)
Adult Psychoeducational Group Note  Date:  07/07/2017 Time:  2:56 AM  Group Topic/Focus:  Wrap-Up Group:   The focus of this group is to help patients review their daily goal of treatment and discuss progress on daily workbooks.  Participation Level:  Active  Participation Quality:  Appropriate  Affect:  Appropriate  Cognitive:  Appropriate  Insight: Appropriate  Engagement in Group:  Engaged  Modes of Intervention:  Discussion  Additional Comments: Pt attended the AA meeting without any concerns.  Felipa Furnace 07/07/2017, 2:56 AM

## 2017-07-07 NOTE — [facility] Group Notes (Signed)
The focus of this group is to educate the patient on the purpose and policies of crisis stabilization and provide a format to answer questions about their admission.  The group details unit policies and expectations of patients while admitted.  Patient did not attend 0900 nurse education orientation group this morning.  Patient stayed in room.  

## 2017-07-07 NOTE — Progress Notes (Signed)
Recreation Therapy Notes   Animal-Assisted Activity (AAA) Program Checklist/Progress Notes Patient Eligibility Criteria Checklist & Daily Group note for Rec TxIntervention  Date: 09.26.2018 Time: 2:45pm Location: [address] Dayroom   AAA/T Program Assumption of Risk Form signed by Patient/ or Parent Legal Guardian Yes  Patient is free of allergies or sever asthma Yes  Patient reports no fear of animals Yes  Patient reports no history of cruelty to animals Yes  Patient understands his/her participation is voluntary Yes  Behavioral Response: Did not attend.   Hedaya Latendresse L Aarion Metzgar, LRT/CTRS       Izick Gasbarro L 07/07/2017 3:03 PM 

## 2017-07-07 NOTE — [facility] Suicide Risk Assessment (Signed)
[facility] INPATIENT:  Family/Significant Other Suicide Prevention Education  Suicide Prevention Education:  Patient Refusal for Family/Significant Other Suicide Prevention Education: The patient Seth Nolan has refused to provide written consent for family/significant other to be provided Family/Significant Other Suicide Prevention Education during admission and/or prior to discharge.  Physician notified.  Jonathon Jordan, MSW, LCSWA  07/07/2017, 4:24 PM

## 2017-07-07 NOTE — H&P (Signed)
Psychiatric Admission Assessment Adult  Patient Identification: Seth Nolan MRN:  [id number] Date of Evaluation:  07/07/2017 Chief Complaint:  Suicidal thoughts Principal Diagnosis: SIMD Diagnosis:   Patient Active Problem List   Diagnosis Date Noted  . MDD (major depressive disorder), recurrent, severe, with psychosis (HCC) [F33.3] 07/06/2017   History of Present Illness:  31 y.o AAM single, employed, lives with his mom and sisters. Brought to the ER by his mom. Intoxicated with alcohol and THC. BAL was 393 mg/dl. Expressed suicidal thoughts and thoughts of hurting others. Reported to have expressed thoughts of drinking himself to death. Reported to have expressed rage towards anyone who gets into his way. Routine labs was essentially normal. Patient has a family history of addiction on both sides of his family. He has been drinking for since he was [age] years of age. Reports daily pattern of drinking. Drinks over six beers daily. Blacks out once drunk. History of alcohol related gastritis in the past. History of DUI in the past. Has a court date next month for DWI.  At interview, patient notes that alcohol has been causing a lot of problems in his life. Says he lost his previous job from effects of alcohol. Says his girlfriend left with their daughter as he was under the influence all the time. Patient says he wants to get better as that is the only condition his girlfriend would come back into his life. Says his family and his daughter paid him a visit yesterday. Patient says he plans to attend AA and addiction class as suggested by his lawyer. Says he was intoxicated when he had suicidal and homicidal thoughts. States that now he is sober all those thoughts has resolved. Describes abnormal perception while under the influence. Says it has resolved since he has been in the hospital. Patient reports negative ruminations when sober. Says he uses alcohol to deal with it. Says now he has stayed  sober longer, he has realized that alcohol is not the right way to cope. No longer critical of self. Wants to get his life back on track. Says he just started a new job three weeks ago.  Patient says he has been in good spirits here. He has been enjoying groups and TV here. Slept well with Trazodone. Eating better here. His energy is back to normal. No tactile, visual or auditory hallucinations. No persecution or feeling of impending doom. No current suicidal , homicidal or violent thoughts. He has been in hospital setting for over seventy two hours and received benzodiazepines. No lingering withdrawal symptoms. Reports use of THC. No synthetic products or any other illicit substance use.   Total Time spent with patient: 1 hour  Past Psychiatric History:  No past psychiatry hospitalization. Never been on any psychotropic medication. No past suicidal behavior. No past history of mania. Transient psychosis and depression in context of substance use. No past chemical dependency treatment. Never affiliated to AA in the past. No past history of violent behavior. Family has weapons but states that his father has secured them.   Is the patient at risk to self? No.  Has the patient been a risk to self in the past 6 months? No.  Has the patient been a risk to self within the distant past? No.  Is the patient a risk to others? No.  Has the patient been a risk to others in the past 6 months? No.  Has the patient been a risk to others within the distant past? No.  Prior Inpatient Therapy:   Prior Outpatient Therapy:    Alcohol Screening: 1. How often do you have a drink containing alcohol?: 4 or more times a week 2. How many drinks containing alcohol do you have on a typical day when you are drinking?: 10 or more 3. How often do you have six or more drinks on one occasion?: Daily or almost daily Preliminary Score: 8 4. How often during the last year have you found that you were not able to stop drinking  once you had started?: Daily or almost daily 5. How often during the last year have you failed to do what was normally expected from you becasue of drinking?: Daily or almost daily 6. How often during the last year have you needed a first drink in the morning to get yourself going after a heavy drinking session?: Daily or almost daily 7. How often during the last year have you had a feeling of guilt of remorse after drinking?: Daily or almost daily 8. How often during the last year have you been unable to remember what happened the night before because you had been drinking?: Weekly 9. Have you or someone else been injured as a result of your drinking?: Yes, during the last year 10. Has a relative or friend or a doctor or another health worker been concerned about your drinking or suggested you cut down?: Yes, during the last year Alcohol Use Disorder Identification Test Final Score (AUDIT): 39 Brief Intervention: Yes Substance Abuse History in the last 12 months:  Yes.   Consequences of Substance Abuse: As above Previous Psychotropic Medications: No  Psychological Evaluations: No  Past Medical History:  Past Medical History:  Diagnosis Date  . Acute alcoholic gastritis without hemorrhage 08/2016  . ETOH abuse   . Marijuana abuse    History reviewed. No pertinent surgical history. Family History: History reviewed. No pertinent family history. Family Psychiatric  History: Strong family history of addiction on both sides of his family. No family history of mental illness or suicide.  Tobacco Screening: Have you used any form of tobacco in the last 30 days? (Cigarettes, Smokeless Tobacco, Cigars, and/or Pipes): Yes Tobacco use, Select all that apply: 5 or more cigarettes per day Are you interested in Tobacco Cessation Medications?: No, patient refused Counseled patient on smoking cessation including recognizing danger situations, developing coping skills and basic information about quitting  provided: Yes Social History:  History  Alcohol Use  . 1.2 oz/week  . 2 Cans of beer per week     History  Drug Use  . Frequency: 7.0 times per week  . Types: Marijuana    Comment: pt states whenever he is bored/daily    Additional Social History:    Raised by his biological parents. No childhood adversity. Did not like school. Left while at high school. Has been in his current relationship for six years. They have a four year old daughter together. Never married. Says his girlfriend is supportive and would come back if he deals with his addiction. Past DUI's. Court date is for DWI. No other legal issues. Never been in prison. christian by faith. Good support from his family.  Allergies:  No Known Allergies Lab Results: No results found for this or any previous visit (from the past 48 hour(s)).  Blood Alcohol level:  Lab Results  Component Value Date   ETH 393 (HH) 07/04/2017    Metabolic Disorder Labs:  No results found for: HGBA1C, MPG No results found for:  PROLACTIN No results found for: CHOL, TRIG, HDL, CHOLHDL, VLDL, LDLCALC  Current Medications: Current Facility-Administered Medications  Medication Dose Route Frequency Provider Last Rate Last Dose  . acetaminophen (TYLENOL) tablet 650 mg  650 mg Oral Q6H PRN Thermon Leyland, NP      . alum & mag hydroxide-simeth (MAALOX/MYLANTA) 200-200-20 MG/5ML suspension 30 mL  30 mL Oral Q4H PRN Fransisca Kaufmann A, NP      . hydrOXYzine (ATARAX/VISTARIL) tablet 25 mg  25 mg Oral Q6H PRN Fransisca Kaufmann A, NP      . magnesium hydroxide (MILK OF MAGNESIA) suspension 30 mL  30 mL Oral Daily PRN Thermon Leyland, NP      . traZODone (DESYREL) tablet 50 mg  50 mg Oral QHS PRN Thermon Leyland, NP   50 mg at 07/06/17 2147   PTA Medications: Prescriptions Prior to Admission  Medication Sig Dispense Refill Last Dose  . omeprazole (PRILOSEC) 20 MG capsule Take 1 capsule (20 mg total) by mouth 2 (two) times daily before a meal. (Patient not taking:  Reported on 07/04/2017) 20 capsule 0 Not Taking at Unknown time  . promethazine (PHENERGAN) 25 MG tablet Take 1 tablet (25 mg total) by mouth every 6 (six) hours as needed for nausea or vomiting. (Patient not taking: Reported on 07/04/2017) 15 tablet 0 Completed Course at Unknown time    Musculoskeletal: Strength & Muscle Tone: within normal limits Gait & Station: normal Patient leans: N/A  Psychiatric Specialty Exam: Physical Exam  Constitutional: He is oriented to person, place, and time. He appears well-developed and well-nourished.  HENT:  Head: Normocephalic and atraumatic.  Eyes: Pupils are equal, round, and reactive to light. Conjunctivae are normal.  Respiratory: Effort normal.  Neurological: He is alert and oriented to person, place, and time.  Skin: Skin is warm and dry.  Psychiatric:  As above    ROS  Blood pressure 129/86, pulse 68, temperature 98.3 F (36.8 C), temperature source Oral, resp. rate 16, height  (1.88 m), weight 72.6 kg (160 lb).Body mass index is 20.54 kg/m.  General Appearance: Neatly dressed, not shaky, not sweaty, not confused. Not unsteady, normal conjugate eye movements. Not internally distressed. Appropriate behavior.   Eye Contact:  Good  Speech:  Clear and Coherent and Normal Rate  Volume:  Normal  Mood:  Euthymic  Affect:  Appropriate and Restricted  Thought Process:  Linear  Orientation:  Full (Time, Place, and Person)  Thought Content:  Rumination on effects alcohol has had in his life. No violent thoughts. No hallucination in any modality.   Suicidal Thoughts:  No  Homicidal Thoughts:  No  Memory:  Immediate;   Good Recent;   Good Remote;   Good  Judgement:  Good  Insight:  Good  Psychomotor Activity:  Normal  Concentration:  Concentration: Good and Attention Span: Good  Recall:  Good  Fund of Knowledge:  Good  Language:  Good  Akathisia:  Negative  Handed:    AIMS (if indicated):     Assets:  Communication Skills Desire for  Improvement Housing Intimacy Physical Health Resilience Social Support Transportation Vocational/Educational  ADL's:  Intact  Cognition:  WNL  Sleep:  Number of Hours: 5.75    Treatment Plan Summary: Patient has a genetic predisposition to addiction. He has been using alcohol and THC for many years. Dependence on substances has been affecting all spheres of his life. No dangerousness now he is sober. No evidence of depression , mania or psychosis.  His family brought him in to seek help with his addiction. We discussed evidence based treatment options. We discussed use of naltrexone to address cravings. Patient consented to treatment after we explored the risks and benefits. Patient is not invested in going into rehab. He wants to do IOP and present the certificate at his court date.   Psychiatric: SUD ( alcohol and THC) SIMD  Medical:  Psychosocial:  Legal issues Family conflict  PLAN: 1. Alcohol withdrawal protocol 2. Naltrexone 50 mg daily to target cravings 3. Encourage unit groups and activities 4. Monitor mood, behavior and interaction with peers 5. Motivational enhancement  6. SW would gather collateral from his family and coordinate aftercare   Observation Level/Precautions:  Detox 15 minute checks  Laboratory:    Psychotherapy:    Medications:    Consultations:    Discharge Concerns:    Estimated LOS:  Other:     Physician Treatment Plan for Primary Diagnosis: <principal problem not specified> Long Term Goal(s): Improvement in symptoms so as ready for discharge  Short Term Goals: Ability to identify changes in lifestyle to reduce recurrence of condition will improve, Ability to verbalize feelings will improve, Ability to disclose and discuss suicidal ideas, Ability to demonstrate self-control will improve, Ability to identify and develop effective coping behaviors will improve, Ability to maintain clinical measurements within normal limits will improve,  Compliance with prescribed medications will improve and Ability to identify triggers associated with substance abuse/mental health issues will improve  Physician Treatment Plan for Secondary Diagnosis: Active Problems:   MDD (major depressive disorder), recurrent, severe, with psychosis (HCC)  Long Term Goal(s): Improvement in symptoms so as ready for discharge  Short Term Goals: Ability to identify changes in lifestyle to reduce recurrence of condition will improve, Ability to verbalize feelings will improve, Ability to disclose and discuss suicidal ideas, Ability to demonstrate self-control will improve, Ability to identify and develop effective coping behaviors will improve, Ability to maintain clinical measurements within normal limits will improve, Compliance with prescribed medications will improve and Ability to identify triggers associated with substance abuse/mental health issues will improve  I certify that inpatient services furnished can reasonably be expected to improve the patient's condition.    Georgiann Cocker, MD 9/25/201810:56 AM

## 2017-07-07 NOTE — [facility] Group Notes (Signed)
LCSW Group Therapy Note  07/07/2017 1:15pm  Type of Therapy/Topic:  Group Therapy:  Feelings about Diagnosis  Participation Level: Active  Description of Group:   This group will allow patients to explore their thoughts and feelings about diagnoses they have received. Patients will be guided to explore their level of understanding and acceptance of these diagnoses. Facilitator will encourage patients to process their thoughts and feelings about the reactions of others to their diagnosis and will guide patients in identifying ways to discuss their diagnosis with significant others in their lives. This group will be process-oriented, with patients participating in exploration of their own experiences, giving and receiving support, and processing challenge from other group members.   Therapeutic Goals: 1. Patient will demonstrate understanding of diagnosis as evidenced by identifying two or more symptoms of the disorder 2. Patient will be able to express two feelings regarding the diagnosis 3. Patient will demonstrate their ability to communicate their needs through discussion and/or role play  Summary of Patient Progress: Pt was present for the duration of the group. Pt's affect was appropriate and was engaged in the discussion. Pt states that he is unaware of what his diagnosis is, but that he has been struggling with mental health ans substance use for a while now. Pt states that he has supports in his life but he doesn't always feel comfortable talking to them about everything he is going through.     Therapeutic Modalities:   Cognitive Behavioral Therapy Brief Therapy Feelings Identification    Jonathon Jordan, MSW, [facility] 07/07/2017 3:40 PM

## 2017-07-07 NOTE — Progress Notes (Signed)
D:  Patient's self inventory sheet, patient sleeps good, sleep medication helpful.  Good appetite, normal energy level, good concentration.  Denied depression, hopeless and anxiety.  Withdrawals, cravings.  Denied SI.  Physical problems, denied.  Denied physical pain.  Goal is to lose craving for beer/alcohol.  No discharge plans. A:  Medications administered per MD orders.  Emotional support and encouragement given patient. R:  Denied SI and HI, contracts for safety.  Denied A/V hallucinations.  Safety maintained with 15 minute checks. Patient in bed most of the morning.  Patient does not complain of pain/anxiety.  Talked on phone this morning before lunch.

## 2017-07-08 NOTE — Progress Notes (Signed)
Patient ID: Seth Nolan, male   DOB: Sep 12, 1986, 31 y.o.   MRN: [id number]  DAR: Pt. Denies SI/HI and A/V Hallucinations. He denies withdrawal symptoms at this time. He reports sleep is fair, appetite is good, energy level is normal, and concentration is good. He rates depression, hopelessness, and anxiety 0/10. Patient does not report any pain or discomfort at this time. Support and encouragement provided to the patient. Scheduled medication administered to patient per physician's orders. Patient is minimal with Clinical research associate but cooperative. He is seen in the milieu intermittently however does not appear to be interacting with his peers much. He reports that he looks forward to discharging soon. He states he wants to stay sober for himself and his daughter. Q15 minute checks are maintained for safety.

## 2017-07-08 NOTE — Tx Team (Signed)
Interdisciplinary Treatment and Diagnostic Plan Update 07/08/2017 Time of Session: 9:30am  Seth Nolan  MRN: [id number]  Principal Diagnosis: Substance induced mood disorder (HCC)  Secondary Diagnoses: Principal Problem:   Substance induced mood disorder (HCC) Active Problems:   MDD (major depressive disorder), recurrent, severe, with psychosis (HCC)   Current Medications:  Current Facility-Administered Medications  Medication Dose Route Frequency Provider Last Rate Last Dose  . acetaminophen (TYLENOL) tablet 650 mg  650 mg Oral Q6H PRN Thermon Leyland, NP      . alum & mag hydroxide-simeth (MAALOX/MYLANTA) 200-200-20 MG/5ML suspension 30 mL  30 mL Oral Q4H PRN Fransisca Kaufmann A, NP      . chlordiazePOXIDE (LIBRIUM) capsule 25 mg  25 mg Oral TID PRN Georgiann Cocker, MD      . folic acid (FOLVITE) tablet 1 mg  1 mg Oral Daily Izediuno, Vincent A, MD   1 mg at 07/08/17 4098  . hydrOXYzine (ATARAX/VISTARIL) tablet 25 mg  25 mg Oral Q6H PRN Fransisca Kaufmann A, NP      . magnesium hydroxide (MILK OF MAGNESIA) suspension 30 mL  30 mL Oral Daily PRN Fransisca Kaufmann A, NP      . naltrexone (DEPADE) tablet 50 mg  50 mg Oral Daily Izediuno, Delight Ovens, MD   50 mg at 07/08/17 1191  . nicotine (NICODERM CQ - dosed in mg/24 hours) patch 21 mg  21 mg Transdermal Daily Cobos, Rockey Situ, MD   21 mg at 07/08/17 0823  . thiamine (VITAMIN B-1) tablet 100 mg  100 mg Oral Daily Izediuno, Delight Ovens, MD   100 mg at 07/08/17 4782  . traZODone (DESYREL) tablet 50 mg  50 mg Oral QHS PRN Thermon Leyland, NP   50 mg at 07/07/17 2307    PTA Medications: Prescriptions Prior to Admission  Medication Sig Dispense Refill Last Dose  . omeprazole (PRILOSEC) 20 MG capsule Take 1 capsule (20 mg total) by mouth 2 (two) times daily before a meal. (Patient not taking: Reported on 07/04/2017) 20 capsule 0 Not Taking at Unknown time  . promethazine (PHENERGAN) 25 MG tablet Take 1 tablet (25 mg total) by mouth every 6 (six) hours  as needed for nausea or vomiting. (Patient not taking: Reported on 07/04/2017) 15 tablet 0 Completed Course at Unknown time    Treatment Modalities: Medication Management, Group therapy, Case management,  1 to 1 session with clinician, Psychoeducation, Recreational therapy.  Patient Stressors: Financial difficulties Marital or family conflict Substance abuse Patient Strengths: Ability for insight Average or above average Radio producer for treatment/growth  Physician Treatment Plan for Primary Diagnosis: Substance induced mood disorder (HCC) Long Term Goal(s): Improvement in symptoms so as ready for discharge Short Term Goals: Ability to identify changes in lifestyle to reduce recurrence of condition will improve Ability to verbalize feelings will improve Ability to disclose and discuss suicidal ideas Ability to demonstrate self-control will improve Ability to identify and develop effective coping behaviors will improve Ability to maintain clinical measurements within normal limits will improve Compliance with prescribed medications will improve Ability to identify triggers associated with substance abuse/mental health issues will improve Ability to identify changes in lifestyle to reduce recurrence of condition will improve Ability to verbalize feelings will improve Ability to disclose and discuss suicidal ideas Ability to demonstrate self-control will improve Ability to identify and develop effective coping behaviors will improve Ability to maintain clinical measurements within normal limits will improve Compliance with prescribed medications will improve Ability to  identify triggers associated with substance abuse/mental health issues will improve  Medication Management: Evaluate patient's response, side effects, and tolerance of medication regimen.  Therapeutic Interventions: 1 to 1 sessions, Unit Group sessions and Medication  administration.  Evaluation of Outcomes: Progressing  Physician Treatment Plan for Secondary Diagnosis: Principal Problem:   Substance induced mood disorder (HCC) Active Problems:   MDD (major depressive disorder), recurrent, severe, with psychosis (HCC)  Long Term Goal(s): Improvement in symptoms so as ready for discharge  Short Term Goals: Ability to identify changes in lifestyle to reduce recurrence of condition will improve Ability to verbalize feelings will improve Ability to disclose and discuss suicidal ideas Ability to demonstrate self-control will improve Ability to identify and develop effective coping behaviors will improve Ability to maintain clinical measurements within normal limits will improve Compliance with prescribed medications will improve Ability to identify triggers associated with substance abuse/mental health issues will improve Ability to identify changes in lifestyle to reduce recurrence of condition will improve Ability to verbalize feelings will improve Ability to disclose and discuss suicidal ideas Ability to demonstrate self-control will improve Ability to identify and develop effective coping behaviors will improve Ability to maintain clinical measurements within normal limits will improve Compliance with prescribed medications will improve Ability to identify triggers associated with substance abuse/mental health issues will improve  Medication Management: Evaluate patient's response, side effects, and tolerance of medication regimen.  Therapeutic Interventions: 1 to 1 sessions, Unit Group sessions and Medication administration.  Evaluation of Outcomes: Progressing  RN Treatment Plan for Primary Diagnosis: Substance induced mood disorder (HCC) Long Term Goal(s): Knowledge of disease and therapeutic regimen to maintain health will improve  Short Term Goals: Ability to verbalize feelings will improve, Ability to disclose and discuss suicidal ideas  and Compliance with prescribed medications will improve  Medication Management: RN will administer medications as ordered by provider, will assess and evaluate patient's response and provide education to patient for prescribed medication. RN will report any adverse and/or side effects to prescribing provider.  Therapeutic Interventions: 1 on 1 counseling sessions, Psychoeducation, Medication administration, Evaluate responses to treatment, Monitor vital signs and CBGs as ordered, Perform/monitor CIWA, COWS, AIMS and Fall Risk screenings as ordered, Perform wound care treatments as ordered.  Evaluation of Outcomes: Progressing  LCSW Treatment Plan for Primary Diagnosis: Substance induced mood disorder (HCC) Long Term Goal(s): Safe transition to appropriate next level of care at discharge, Engage patient in therapeutic group addressing interpersonal concerns. Short Term Goals: Engage patient in aftercare planning with referrals and resources, Facilitate patient progression through stages of change regarding substance use diagnoses and concerns, Identify triggers associated with mental health/substance abuse issues and Increase skills for wellness and recovery  Therapeutic Interventions: Assess for all discharge needs, 1 to 1 time with Social worker, Explore available resources and support systems, Assess for adequacy in community support network, Educate family and significant other(s) on suicide prevention, Complete Psychosocial Assessment, Interpersonal group therapy.  Evaluation of Outcomes: Progressing  Progress in Treatment: Attending groups: Yes Participating in groups: Yes Taking medication as prescribed: Yes, MD continues to assess for medication changes as needed Toleration medication: Yes, no side effects reported at this time Family/Significant other contact made: No, pt declined contact. Patient understands diagnosis: Developing insight  Discussing patient identified problems/goals  with staff: Yes Medical problems stabilized or resolved: Yes Denies suicidal/homicidal ideation: Yes Issues/concerns per patient self-inventory: None Other: N/A  New problem(s) identified: None identified at this time.   New Short Term/Long Term Goal(s): "I'd like  to cut off my drinking habits and get back to a normal life"  Discharge Plan or Barriers: Pt will return home and follow up with an outpatient provider.  Reason for Continuation of Hospitalization:  Medication stabilization Withdrawal symptoms  Estimated Length of Stay: 1-3 days; Estimated discharge date 07/11/17.  Attendees: Patient: Seth Nolan 07/08/2017 9:00 AM  Physician: Dr. Jackquline Berlin 07/08/2017 9:00 AM  Nursing: Foy Guadalajara, RN 07/08/2017 9:00 AM  RN Care Manager:  07/08/2017 9:00 AM  Social Worker: Donnelly Stager, LCSWA 07/08/2017 9:00 AM  Recreational Therapist:  07/08/2017 9:00 AM  Other:  07/08/2017 9:00 AM  Other:  07/08/2017 9:00 AM  Other: 07/08/2017 9:00 AM  Scribe for Treatment Team: Jonathon Jordan, MSW,LCSWA 07/08/2017 9:00 AM

## 2017-07-08 NOTE — [facility] Group Notes (Signed)
[facility] Mental Health Association Group Therapy 07/08/2017 1:15pm  Type of Therapy: Mental Health Association Presentation  Participation Level: Active  Participation Quality: Attentive  Affect: Appropriate  Cognitive: Oriented  Insight: Developing/Improving  Engagement in Therapy: Engaged  Modes of Intervention: Discussion, Education and Socialization  Summary of Progress/Problems: Mental Health Association (MHA) Speaker came to talk about his personal journey with living with a mental health diagnosis. The pt processed ways by which to relate to the speaker. MHA speaker provided handouts and educational information pertaining to groups and services offered by the [facility]. Pt was engaged in speaker's presentation and was receptive to resources provided.    Jonathon Jordan, MSW, LCSWA 07/08/2017 4:44 PM

## 2017-07-08 NOTE — Progress Notes (Signed)
[facility] MD Progress Note  07/08/2017 11:41 AM Seth Nolan  MRN:  [id number] Subjective:   31 y.o AAM single, employed, lives with his mom and sisters. Brought to the ER by his mom. Intoxicated with alcohol and THC. BAL was 393 mg/dl. Expressed suicidal thoughts and thoughts of hurting others. Reported to have expressed thoughts of drinking himself to death. Reported to have expressed rage towards anyone who gets into his way. Routine labs was essentially normal.  Chart reviewed today. Patient discussed at team today.  Staff reports that he has been appropriate on the unit. He has not been observed to be internally stimulated. Vitals has been stable. CIWA scores are trending down. He attended some of the groups.   Seen today. Says his goal is to cut off his drinking habits. Feels supported by is family. No motivation to get into rehab. Says he would consider outpatient. Still has some cravings. On benzodiazepine taper. No sweatiness, no headaches. No retching, nausea or vomiting. No fullness in the head. No visual, tactile or auditory hallucination. No internal restlessness. No suicidal or homicidal thoughts.  Principal Problem: Substance induced mood disorder (HCC) Diagnosis:   Patient Active Problem List   Diagnosis Date Noted  . 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 MDD (major depressive disorder), recurrent, severe, with psychosis (HCC) [F33.3] 07/06/2017   Total Time spent with patient: 20 minutes  Past Psychiatric History: As in H&P  Past Medical History:  Past Medical History:  Diagnosis Date  . Acute alcoholic gastritis without hemorrhage 08/2016  . ETOH abuse   . Marijuana abuse    History reviewed. No pertinent surgical history. Family History: History reviewed. No pertinent family history. Family Psychiatric  History: As in H&P  Social History:  History  Alcohol Use  . 1.2 oz/week  . 2 Cans of beer per week     History  Drug Use  . Frequency:  7.0 times per week  . Types: Marijuana    Comment: pt states whenever he is bored/daily    Social History   Social History  . Marital status: Single    Spouse name: N/A  . Number of children: N/A  . Years of education: N/A   Social History Main Topics  . Smoking status: Current Every Day Smoker    Packs/day: 0.25    Types: Cigarettes  . Smokeless tobacco: Never Used  . Alcohol use 1.2 oz/week    2 Cans of beer per week  . Drug use: Yes    Frequency: 7.0 times per week    Types: Marijuana     Comment: pt states whenever he is bored/daily  . Sexual activity: Yes   Other Topics Concern  . None   Social History Narrative  . None   Additional Social History:    Sleep: Good  Appetite:  Good  Current Medications: Current Facility-Administered Medications  Medication Dose Route Frequency Provider Last Rate Last Dose  . acetaminophen (TYLENOL) tablet 650 mg  650 mg Oral Q6H PRN Thermon Leyland, NP      . alum & mag hydroxide-simeth (MAALOX/MYLANTA) 200-200-20 MG/5ML suspension 30 mL  30 mL Oral Q4H PRN Fransisca Kaufmann A, NP      . chlordiazePOXIDE (LIBRIUM) capsule 25 mg  25 mg Oral TID PRN Georgiann Cocker, MD      . folic acid (FOLVITE) tablet 1 mg  1 mg Oral Daily Victorina Kable, Delight Ovens, MD   1 mg at 07/08/17 4098  . hydrOXYzine (  ATARAX/VISTARIL) tablet 25 mg  25 mg Oral Q6H PRN Fransisca Kaufmann A, NP      . magnesium hydroxide (MILK OF MAGNESIA) suspension 30 mL  30 mL Oral Daily PRN Fransisca Kaufmann A, NP      . naltrexone (DEPADE) tablet 50 mg  50 mg Oral Daily Ranie Chinchilla, Delight Ovens, MD   50 mg at 07/08/17 4540  . nicotine (NICODERM CQ - dosed in mg/24 hours) patch 21 mg  21 mg Transdermal Daily Cobos, Rockey Situ, MD   21 mg at 07/08/17 0823  . thiamine (VITAMIN B-1) tablet 100 mg  100 mg Oral Daily Kalyn Dimattia, Delight Ovens, MD   100 mg at 07/08/17 9811  . traZODone (DESYREL) tablet 50 mg  50 mg Oral QHS PRN Thermon Leyland, NP   50 mg at 07/07/17 2307    Lab Results: No results found  for this or any previous visit (from the past 48 hour(s)).  Blood Alcohol level:  Lab Results  Component Value Date   ETH 393 (HH) 07/04/2017    Metabolic Disorder Labs: No results found for: HGBA1C, MPG No results found for: PROLACTIN No results found for: CHOL, TRIG, HDL, CHOLHDL, VLDL, LDLCALC  Physical Findings: AIMS: Facial and Oral Movements Muscles of Facial Expression: None, normal Lips and Perioral Area: None, normal Jaw: None, normal Tongue: None, normal,Extremity Movements Upper (arms, wrists, hands, fingers): None, normal Lower (legs, knees, ankles, toes): None, normal, Trunk Movements Neck, shoulders, hips: None, normal, Overall Severity Severity of abnormal movements (highest score from questions above): None, normal Incapacitation due to abnormal movements: None, normal Patient's awareness of abnormal movements (rate only patient's report): No Awareness, Dental Status Current problems with teeth and/or dentures?: No Does patient usually wear dentures?: No  CIWA:  CIWA-Ar Total: 1 COWS:  COWS Total Score: 1  Musculoskeletal: Strength & Muscle Tone: within normal limits Gait & Station: normal Patient leans: N/A  Psychiatric Specialty Exam: Physical Exam  Constitutional: He is oriented to person, place, and time. He appears well-developed and well-nourished.  HENT:  Head: Normocephalic and atraumatic.  Respiratory: Effort normal.  Neurological: He is alert and oriented to person, place, and time.  Skin: Skin is warm and dry.  Psychiatric:  As above    ROS  Blood pressure 128/80, pulse 69, temperature 97.6 F (36.4 C), temperature source Oral, resp. rate 16, height  (1.88 m), weight 72.6 kg (160 lb).Body mass index is 20.54 kg/m.  General Appearance: Neatly dressed, calm and cooperative. Appropriate behavior. No tremors. Not sweaty. Not confused  Eye Contact:  Good  Speech:  Soft spoken. Normal rate  Volume:  Normal  Mood:  Subjectively feels  good. Objectively better today.   Affect:  Appropriate and Restricted  Thought Process:  Linear  Orientation:  Full (Time, Place, and Person)  Thought Content:  No delusional theme. No preoccupation with violent thoughts. No negative ruminations. No obsession.  No hallucination in any modality.   Suicidal Thoughts:  No  Homicidal Thoughts:  No  Memory:  Immediate;   Good Recent;   Good Remote;   Good  Judgement:  Good  Insight:  Partial as he is not fully invested in relapse preventive measures.   Psychomotor Activity:  Normal  Concentration:  Concentration: Good and Attention Span: Good  Recall:  Good  Fund of Knowledge:  Good  Language:  Good  Akathisia:  Negative  Handed:    AIMS (if indicated):     Assets:  Communication Skills Housing Intimacy  Physical Health Social Support Transportation  ADL's:  Intact  Cognition:  WNL  Sleep:  Number of Hours: 5.75     Treatment Plan Summary: Patient is smoothly coming off alcohol. No complications so far. No dangerousness. He is still on benzodiazepine taper. We would evaluate him further. Hopeful discharge by Friday.   Psychiatric: SUD ( alcohol and THC) SIMD  Medical:  Psychosocial:  Legal issues Family conflict  PLAN: 1. Continue current regimen 2. Continue to encourage unit groups and activities 3. Feedback from his family  Georgiann Cocker, MD 07/08/2017, 11:41 AM

## 2017-07-09 MED ORDER — NICOTINE POLACRILEX 2 MG MT GUM
2.0000 mg | CHEWING_GUM | OROMUCOSAL | Status: DC | PRN
Start: 2017-07-09 — End: 2017-07-10

## 2017-07-09 NOTE — Progress Notes (Signed)
D: Patient seen on dayroom interaction well with peers. Cheerful and pleasant upon approach. States he had a "good" day. Denies pain, SI/HI, AH/VH at this time. Verbalized no concern. No behavior issues noted.  A: Staff offered support and encouragement as needed. Routine safety checks maintained. Will continue to monitor patient  R: Patient is safe on unit.

## 2017-07-09 NOTE — [facility] Group Notes (Signed)
LCSW Group Therapy Note  07/09/2017 1:15pm  Type of Therapy and Topic:  Group Therapy: Avoiding Self-Sabotaging and Enabling Behaviors  Participation Level:  Minimal   Description of Group:   In this group, patients will learn how to identify obstacles, self-sabotaging and enabling behaviors, as well as: what are they, why do we do them and what needs these behaviors meet. Discuss unhealthy relationships and how to have positive healthy boundaries with those that sabotage and enable. Explore aspects of self-sabotage and enabling in yourself and how to limit these self-destructive behaviors in everyday life.   Therapeutic Goals: 1. Patient will identify one obstacle that relates to self-sabotage and enabling behaviors 2. Patient will identify one personal self-sabotaging or enabling behavior they did prior to admission 3. Patient will state a plan to change the above identified behavior 4. Patient will demonstrate ability to communicate their needs through discussion and/or role play.   Summary of Patient Progress:  Taddarius was attentive during today's processing group; he did not actively participate in group discussion. Taddarius asked about his discharge plan and is open to outpatient care at [facility]. He continues to show some progress in the group setting with improving insight.    Therapeutic Modalities:   Cognitive Behavioral Therapy Person-Centered Therapy Motivational Interviewing   [employer], LCSW 07/09/2017 3:28 PM

## 2017-07-09 NOTE — Plan of Care (Signed)
Problem: Activity: Goal: Sleeping patterns will improve Outcome: Progressing Slept 6.25 hours last night according to flowsheet.    

## 2017-07-09 NOTE — Progress Notes (Signed)
D: Pt was in the dayroom upon initial approach.  Pt presents with anxious affect and mood.  He reports he is "doing good" and his goal is to "get happy, get up out of here."  Pt reports he discharges tomorrow and he feels safe to do so.  Pt denies SI/HI, denies hallucinations, denies pain.  Pt has been visible in milieu interacting with peers and staff appropriately.  Pt attended evening group.    A: Introduced self to pt.  Actively listened to pt and offered support and encouragement. PRN medication administered for anxiety and sleep.  Q15 minute safety checks maintained.  R: Pt is safe on the unit.  Pt is compliant with medications.  Pt verbally contracts for safety.  Will continue to monitor and assess.

## 2017-07-09 NOTE — Progress Notes (Signed)
D: Patient observed to be isolative at times. Pleasant and cooperative.  Patient verbalizes to this Clinical research associate he is doing well. Patient's affect appropriate, mood anxious but stabilizing. Per self inventory and discussions with writer, rates depression at a 0/10, hopelessness at a 0/10 and anxiety at a 0/10. Rates sleep as good, appetite as good, energy as normal and concentration as good.  States goal for today is to "be more open minded, stop keeping my thoughts bottled up and be more talkative." Denies pain, physical problems.   A: Medicated per orders, no prns requested or required. Level III obs in place for safety. Emotional support offered and self inventory reviewed. Encouraged completion of Suicide Safety Plan and programming participation. Discussed POC with MD, SW.  R: Patient verbalizes understanding of POC. Patient denies SI/HI/AVH and remains safe on level III obs. Will continue to monitor closely and make verbal contact frequently.

## 2017-07-09 NOTE — Plan of Care (Signed)
Problem: Education: Goal: Knowledge of [facility] Education information/materials will improve Outcome: Completed/Met Date Met: 07/09/17 Patient verbalizes understanding of [facility] policies, tx plan.  Problem: Safety: Goal: Periods of time without injury will increase Outcome: Completed/Met Date Met: 07/09/17 Patient has not engaged in self harm, denies thoughts to do so. No SI.

## 2017-07-09 NOTE — Progress Notes (Signed)
[facility] Group Notes:  (Nursing/MHT/Case Management/Adjunct)  Date:  07/09/2017  Time:  2045  Type of Therapy:  wrap up group  Participation Level:  Active  Participation Quality:  Appropriate, Attentive, Sharing and Supportive  Affect:  Appropriate  Cognitive:  Appropriate  Insight:  Improving  Engagement in Group:  Engaged  Modes of Intervention:  Clarification, Education and Support  Summary of Progress/Problems: Pt shared that if he could change any one thing in his life it would be to have never picked the bottle up. Pt is thankful he woke up today and is grateful for his 29 year old daughter.   Marcille Buffy 07/09/2017, 9:38 PM

## 2017-07-09 NOTE — Progress Notes (Signed)
[facility] MD Progress Note  07/09/2017 6:10 PM Seth Nolan  MRN:  [id number] Subjective:   31 y.o AAM single, employed, lives with his mom and sisters. Brought to the ER by his mom. Intoxicated with alcohol and THC. BAL was 393 mg/dl. Expressed suicidal thoughts and thoughts of hurting others. Reported to have expressed thoughts of drinking himself to death. Reported to have expressed rage towards anyone who gets into his way. Routine labs was essentially normal.  Chart reviewed today. Patient discussed at team today.  Nursing staff reports that patient has been appropriate on the unit. Patient has been interacting well with peers. No behavioral issues. Patient has not voiced any suicidal thoughts. Patient has not been observed to be internally stimulated. Patient has been adherent with treatment recommendations. Patient has been tolerating their medication well.   Seen today. In good spirits. Looking forward to discharge. Not craving for alcohol. No suicidal thoughts. No evidence of depression. No evidence of psychosis. No anxiety.   Principal Problem: Substance induced mood disorder (HCC) Diagnosis:   Patient Active Problem List   Diagnosis Date Noted  . 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 MDD (major depressive disorder), recurrent, severe, with psychosis (HCC) [F33.3] 07/06/2017   Total Time spent with patient: 20 minutes  Past Psychiatric History: As in H&P  Past Medical History:  Past Medical History:  Diagnosis Date  . Acute alcoholic gastritis without hemorrhage 08/2016  . ETOH abuse   . Marijuana abuse    History reviewed. No pertinent surgical history. Family History: History reviewed. No pertinent family history. Family Psychiatric  History: As in H&P  Social History:  History  Alcohol Use  . 1.2 oz/week  . 2 Cans of beer per week     History  Drug Use  . Frequency: 7.0 times per week  . Types: Marijuana    Comment: pt states whenever he is  bored/daily    Social History   Social History  . Marital status: Single    Spouse name: N/A  . Number of children: N/A  . Years of education: N/A   Social History Main Topics  . Smoking status: Current Every Day Smoker    Packs/day: 0.25    Types: Cigarettes  . Smokeless tobacco: Never Used  . Alcohol use 1.2 oz/week    2 Cans of beer per week  . Drug use: Yes    Frequency: 7.0 times per week    Types: Marijuana     Comment: pt states whenever he is bored/daily  . Sexual activity: Yes   Other Topics Concern  . None   Social History Narrative  . None   Additional Social History:    Sleep: Good  Appetite:  Good  Current Medications: Current Facility-Administered Medications  Medication Dose Route Frequency Provider Last Rate Last Dose  . acetaminophen (TYLENOL) tablet 650 mg  650 mg Oral Q6H PRN Thermon Leyland, NP      . alum & mag hydroxide-simeth (MAALOX/MYLANTA) 200-200-20 MG/5ML suspension 30 mL  30 mL Oral Q4H PRN Fransisca Kaufmann A, NP      . chlordiazePOXIDE (LIBRIUM) capsule 25 mg  25 mg Oral TID PRN Jaeley Wiker, Delight Ovens, MD      . folic acid (FOLVITE) tablet 1 mg  1 mg Oral Daily Corlette Ciano, Delight Ovens, MD   1 mg at 07/09/17 0813  . hydrOXYzine (ATARAX/VISTARIL) tablet 25 mg  25 mg Oral Q6H PRN Thermon Leyland, NP   25 mg at  07/08/17 2115  . magnesium hydroxide (MILK OF MAGNESIA) suspension 30 mL  30 mL Oral Daily PRN Fransisca Kaufmann A, NP      . naltrexone (DEPADE) tablet 50 mg  50 mg Oral Daily Emric Kowalewski, Delight Ovens, MD   50 mg at 07/09/17 0813  . nicotine (NICODERM CQ - dosed in mg/24 hours) patch 21 mg  21 mg Transdermal Daily Cobos, Rockey Situ, MD   21 mg at 07/08/17 0823  . thiamine (VITAMIN B-1) tablet 100 mg  100 mg Oral Daily Gurnie Duris, Delight Ovens, MD   100 mg at 07/09/17 0813  . traZODone (DESYREL) tablet 50 mg  50 mg Oral QHS PRN Thermon Leyland, NP   50 mg at 07/08/17 2115    Lab Results: No results found for this or any previous visit (from the past 48  hour(s)).  Blood Alcohol level:  Lab Results  Component Value Date   ETH 393 (HH) 07/04/2017    Metabolic Disorder Labs: No results found for: HGBA1C, MPG No results found for: PROLACTIN No results found for: CHOL, TRIG, HDL, CHOLHDL, VLDL, LDLCALC  Physical Findings: AIMS: Facial and Oral Movements Muscles of Facial Expression: None, normal Lips and Perioral Area: None, normal Jaw: None, normal Tongue: None, normal,Extremity Movements Upper (arms, wrists, hands, fingers): None, normal Lower (legs, knees, ankles, toes): None, normal, Trunk Movements Neck, shoulders, hips: None, normal, Overall Severity Severity of abnormal movements (highest score from questions above): None, normal Incapacitation due to abnormal movements: None, normal Patient's awareness of abnormal movements (rate only patient's report): No Awareness, Dental Status Current problems with teeth and/or dentures?: No Does patient usually wear dentures?: No  CIWA:  CIWA-Ar Total: 1 COWS:  COWS Total Score: 1  Musculoskeletal: Strength & Muscle Tone: within normal limits Gait & Station: normal Patient leans: N/A  Psychiatric Specialty Exam: Physical Exam  Constitutional: He is oriented to person, place, and time. He appears well-developed and well-nourished.  HENT:  Head: Normocephalic and atraumatic.  Respiratory: Effort normal.  Neurological: He is alert and oriented to person, place, and time.  Skin: Skin is warm and dry.  Psychiatric:  As above    ROS  Blood pressure 127/86, pulse 62, temperature (!) 97.3 F (36.3 C), temperature source Oral, resp. rate 16, height  (1.88 m), weight 72.6 kg (160 lb).Body mass index is 20.54 kg/m.  General Appearance: Neatly dressed, calm and cooperative. Appropriate behavior. No tremors. Not sweaty. Not confused  Eye Contact:  Good  Speech:  Spontaneous, normal prosody. Normal tone and rate.   Volume:  Normal  Mood:  Euthymic.   Affect:  Full range and  appropriate   Thought Process:  Linear and logical   Orientation:  Full (Time, Place, and Person)  Thought Content:  No delusional theme. No preoccupation with violent thoughts. No negative ruminations. No obsession.  No hallucination in any modality.   Suicidal Thoughts:  No  Homicidal Thoughts:  No  Memory:  Immediate;   Good Recent;   Good Remote;   Good  Judgement:  Good  Insight:  Partial   Psychomotor Activity:  Normal  Concentration:  Concentration: Good and Attention Span: Good  Recall:  Good  Fund of Knowledge:  Good  Language:  Good  Akathisia:  Negative  Handed:    AIMS (if indicated):     Assets:  Communication Skills Housing Intimacy Physical Health Social Support Transportation  ADL's:  Intact  Cognition:  WNL  Sleep:  Number of Hours: 6.25  Treatment Plan Summary: Patient has completely come off alcohol.  No dangerousness. No psychosis. No mania. Not depressed. We would evaluate him overnight. Hopeful discharge tomorrow if he stay stable.   Psychiatric: SUD ( alcohol and THC) SIMD  Medical:  Psychosocial:  Legal issues Family conflict  PLAN: 1. Continue current regimen 2. Continue to encourage unit groups and activities   Georgiann Cocker, MD 07/09/2017, 6:10 PMPatient ID: Seth Nolan, male   DOB: 02-18-1986, 31 y.o.   MRN: [id number]

## 2017-07-10 MED ORDER — NALTREXONE HCL 50 MG PO TABS: 50.0000 mg | ORAL_TABLET | Freq: Every day | ORAL | 0 refills | Status: DC

## 2017-07-10 MED ORDER — HYDROXYZINE HCL 25 MG PO TABS: 25.0000 mg | ORAL_TABLET | Freq: Four times a day (QID) | ORAL | 0 refills | Status: DC | PRN

## 2017-07-10 MED ORDER — TRAZODONE HCL 50 MG PO TABS: 50.0000 mg | ORAL_TABLET | Freq: Every evening | ORAL | 0 refills | Status: DC | PRN

## 2017-07-10 MED ORDER — NICOTINE POLACRILEX 2 MG MT GUM: 2.0000 mg | CHEWING_GUM | OROMUCOSAL | 0 refills | Status: DC | PRN

## 2017-07-10 NOTE — Progress Notes (Signed)
Nursing Discharge Note 07/10/2017 [id number]  Data Reports sleeping good with PRN sleep med.  Rates depression 0/10, hopelessness 0/10, and anxiety 0/10. Affect wide ranged and appropriate mood euthymic.  Denies HI, SI, AVH.  Reports feeling ready to go home and says his parents are supportive of him.  Received discharge orders.  Action Spoke with patient 1:1, nurse offered support to patient throughout shift.  Reviewed medications, discharge instructions, and follow up appointments with patient. Medication samples and scripts reviewed and given to patient.  Paperwork, AVS, SRA, and transition record handed to patient.   Escorted off of unit at 1440. Belongings returned per belongings form.  Discharged to lobby where he was met by sister.    Response Verbalized understanding of discharge teaching. Agrees to contact someone or 911 with thoughts/intent to harm self or others.    To follow up per AVS.

## 2017-07-10 NOTE — Progress Notes (Signed)
  [facility] Adult Case Management Discharge Plan :  Will you be returning to the same living situation after discharge:  Yes,  pt returning home. At discharge, do you have transportation home?: Yes,  pt's family will transport. Do you have the ability to pay for your medications: Yes,  prescriptions and samples provided.  Release of information consent forms completed and in the chart;  Patient's signature needed at discharge.  Patient to Follow up at: Follow-up Information    Services, [facility] Drug Follow up.   Specialty:  Behavioral Health Why:  Please walk-in within 3 days of hospital discharge to be assessed for outpatient mental health services including: medication management and substance abuse intensive outpatient treatment. Walk in hours: Monday, Wednesday, and Friday between 12:30-3PM.  Contact information: [address] [place] [address] [phone]           Next level of care provider has access to [facility] Behavioral Health Link:no  Safety Planning and Suicide Prevention discussed: Yes,  with pt.  Have you used any form of tobacco in the last 30 days? (Cigarettes, Smokeless Tobacco, Cigars, and/or Pipes): Yes  Has patient been referred to the Quitline?: Patient refused referral  Patient has been referred for addiction treatment: Yes   Pt declined residential substance use treatment.  Jonathon Jordan, MSW, LCSWA 07/10/2017, 11:38 AM

## 2017-07-10 NOTE — Progress Notes (Signed)
Adult Psychoeducational Group Note  Date:  07/10/2017 Time:  10:40 AM  Group Topic/Focus:  Healthy Communication:   The focus of this group is to discuss communication, barriers to communication, as well as healthy ways to communicate with others.  Participation Level:  Active  Participation Quality:  Appropriate  Affect:  Appropriate  Cognitive:  Alert and Appropriate  Insight: Appropriate and Good  Engagement in Group:  Improving  Modes of Intervention:  Activity  Additional Comments:  Pt did participate in all group activities today.  Manmeet Arzola R Evony Rezek 07/10/2017, 10:40 AM

## 2017-07-10 NOTE — Discharge Summary (Signed)
Physician Discharge Summary Note  Patient:  Seth Nolan is an 31 y.o., male MRN:  [id number] DOB:  Mar 12, 1986 Patient phone:  [phone] (home)  Patient address:   [address] [place] [address],  Total Time spent with patient: Greater than 30 minutes  Date of Admission:  07/06/2017 Date of Discharge: 07-10-17  Reason for Admission: Alcohol itxication & suicidal ideations.   Principal Problem: Substance induced mood disorder [facility])  Discharge Diagnoses: Patient Active Problem List   Diagnosis Date Noted  . 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 MDD (major depressive disorder), recurrent, severe, with psychosis (HCC) [F33.3] 07/06/2017   Past Psychiatric History: Substance induced disorder.  Past Medical History:  Past Medical History:  Diagnosis Date  . Acute alcoholic gastritis without hemorrhage 08/2016  . ETOH abuse   . Marijuana abuse    History reviewed. No pertinent surgical history.  Family History: History reviewed. No pertinent family history.  Family Psychiatric  History: See H&P  Social History:  History  Alcohol Use  . 1.2 oz/week  . 2 Cans of beer per week     History  Drug Use  . Frequency: 7.0 times per week  . Types: Marijuana    Comment: pt states whenever he is bored/daily    Social History   Social History  . Marital status: Single    Spouse name: N/A  . Number of children: N/A  . Years of education: N/A   Social History Main Topics  . Smoking status: Current Every Day Smoker    Packs/day: 0.25    Types: Cigarettes  . Smokeless tobacco: Never Used  . Alcohol use 1.2 oz/week    2 Cans of beer per week  . Drug use: Yes    Frequency: 7.0 times per week    Types: Marijuana     Comment: pt states whenever he is bored/daily  . Sexual activity: Yes   Other Topics Concern  . None   Social History Narrative  . None   Hospital Course: 31 y.o AAMsingle, employed, lives with his mom and sisters.  Brought to the ER by his mom. Intoxicated with alcohol and THC. BAL was 393 mg/dl. Expressed suicidal thoughts and thoughts of hurting others. Reported to have expressed thoughts of drinking himself to death. Reported to have expressed rage towards anyone who gets into his way. Routine labs was essentially normal.  After evaluation of his presenting symptoms, Seth Nolan received & was discharged on; Hydroxyzine 25 mg prn for anxiety, Naltrexone 50 mg for alcoholism, Nicorette gum 2 mg for smoking cessation & Trazodone 50 mg prn for insomnia. He was enrolled & participated in the group counseling sessions being offered & held on this unit. He learned coping skills. He presented no other significant health issues that required treatment & or monitoring. He tolerated his treatment regimen without any adverse effects or reactions reported.  Seth Nolan is seen today by the attending psychiatrist. No craving for substances were noted. No residual withdrawal symptoms.  Reports that he is in good spirits. Not feeling depressed. Reports normal energy and interest. Has been maintaining normal biological functions. He is able to think clearly. He is able to focus on task. His thoughts are not crowded or racing. No evidence of mania. No hallucination in any modality. He is not making any delusional statement. No passivity of will/thought. He is fully in touch with reality. No thoughts of suicide. No thoughts of homicide. No violent thoughts. No overwhelming anxiety. No acces  to weapons.   The nursing staff reports that patient has been appropriate on the unit. Patient has been interacting well with peers. No behavioral issues. Patient has not voiced any suicidal thoughts. Patient has not been observed to be internally stimulated. Patient has been adherent with treatment recommendations. Patient has been tolerating their medication well.   Patient's case was discussed today at at treatment team meeting. The team members  feels that patient is back to his baseline level of function. Team agrees with plan to discharge patient today to continue mental health care on an outpatient basis as noted below. He is provided with all the necessary information needed to make this appointment without problems. He was provided with a 7 days worth, supply samples of his [facility] discharge medications. He left [facility] in no apparent distress. Transportation per family.  Physical Findings: AIMS: Facial and Oral Movements Muscles of Facial Expression: None, normal Lips and Perioral Area: None, normal Jaw: None, normal Tongue: None, normal,Extremity Movements Upper (arms, wrists, hands, fingers): None, normal Lower (legs, knees, ankles, toes): None, normal, Trunk Movements Neck, shoulders, hips: None, normal, Overall Severity Severity of abnormal movements (highest score from questions above): None, normal Incapacitation due to abnormal movements: None, normal Patient's awareness of abnormal movements (rate only patient's report): No Awareness, Dental Status Current problems with teeth and/or dentures?: No Does patient usually wear dentures?: No  CIWA:  CIWA-Ar Total: 1 COWS:  COWS Total Score: 1  Musculoskeletal: Strength & Muscle Tone: within normal limits Gait & Station: normal Patient leans: N/A  Psychiatric Specialty Exam: Physical Exam  Constitutional: He appears well-developed.  HENT:  Head: Normocephalic.  Eyes: Pupils are equal, round, and reactive to light.  Neck: Normal range of motion.  Cardiovascular: Normal rate.   Respiratory: Effort normal.  GI: Soft.  Genitourinary:  Genitourinary Comments: Deferred  Musculoskeletal: Normal range of motion.  Neurological: He is alert.  Skin: Skin is warm.    Review of Systems  Constitutional: Negative.   HENT: Negative.   Eyes: Negative.   Respiratory: Negative.   Cardiovascular: Negative.   Gastrointestinal: Negative.   Genitourinary: Negative.    Musculoskeletal: Negative.   Skin: Negative.   Neurological: Negative.   Endo/Heme/Allergies: Negative.   Psychiatric/Behavioral: Positive for depression (Stable) and substance abuse (Hx. Alcoholism & THC use disorder). Negative for hallucinations, memory loss and suicidal ideas. The patient has insomnia (Stable). The patient is not nervous/anxious.     Blood pressure 124/76, pulse (!) 59, temperature (!) 97.3 F (36.3 C), temperature source Oral, resp. rate 16, height  (1.88 m), weight 72.6 kg (160 lb).Body mass index is 20.54 kg/m.  See Md's SRA.   Blood Alcohol level:  Lab Results  Component Value Date   ETH 393 (HH) 07/04/2017   Metabolic Disorder Labs:  No results found for: HGBA1C, MPG No results found for: PROLACTIN No results found for: CHOL, TRIG, HDL, CHOLHDL, VLDL, LDLCALC  See Psychiatric Specialty Exam and Suicide Risk Assessment completed by Attending Physician prior to discharge.  Discharge destination:  Home  Is patient on multiple antipsychotic therapies at discharge:  No    Has Patient had three or more failed trials of antipsychotic monotherapy by history:  No  Recommended Plan for Multiple Antipsychotic Therapies: NA  Allergies as of 07/10/2017   No Known Allergies     Medication List    STOP taking these medications   omeprazole 20 MG capsule Commonly known as:  PRILOSEC   promethazine 25 MG tablet Commonly  known as:  PHENERGAN     TAKE these medications     Indication  hydrOXYzine 25 MG tablet Commonly known as:  ATARAX/VISTARIL Take 1 tablet (25 mg total) by mouth every 6 (six) hours as needed for anxiety.  Indication:  Feeling Anxious   naltrexone 50 MG tablet Commonly known as:  DEPADE Take 1 tablet (50 mg total) by mouth daily. For alcoholism  Indication:  Excessive Use of Alcohol   nicotine polacrilex 2 MG gum Commonly known as:  NICORETTE Take 1 each (2 mg total) by mouth as needed for smoking cessation. (May buy from over  the counter at the pharmacy):  Indication:  Nicotine Addiction   traZODone 50 MG tablet Commonly known as:  DESYREL Take 1 tablet (50 mg total) by mouth at bedtime as needed for sleep.  Indication:  Trouble Sleeping      Follow-up Information    Services, Alcohol And Drug Follow up.   Specialty:  Behavioral Health Why:  Please walk-in within 3 days of hospital discharge to be assessed for outpatient mental health services including: medication management and substance abuse intensive outpatient treatment. Walk in hours: Monday, Wednesday, and Friday between 12:30-3PM.  Contact information: [address] [place] [address] [phone]          Follow-up recommendations: Activity:  As tolerated Diet: As recommended by your primary care doctor. Keep all scheduled follow-up appointments as recommended.  Comments: Patient is instructed prior to discharge to: Take all medications as prescribed by his/her mental healthcare provider. Report any adverse effects and or reactions from the medicines to his/her outpatient provider promptly. Patient has been instructed & cautioned: To not engage in alcohol and or illegal drug use while on prescription medicines. In the event of worsening symptoms, patient is instructed to call the crisis hotline, 911 and or go to the nearest ED for appropriate evaluation and treatment of symptoms. To follow-up with his/her primary care provider for your other medical issues, concerns and or health care needs.   Signed: Sanjuana Kava, NP, PMHNO, FNP-BC 07/10/2017, 10:43 AM

## 2017-07-10 NOTE — Progress Notes (Signed)
Recreation Therapy Notes  Date: 07/10/17 Time: 0930 Location: [address] Dayroom  Group Topic: Stress Management  Goal Area(s) Addresses:  Patient will verbalize importance of using healthy stress management.  Patient will identify positive emotions associated with healthy stress management.   Behavioral Response: Engaged  Intervention: Stress Management  Activity :  Guided Imagery.  LRT introduced the stress management technique of guided imagery.  Patients were to listen and follow along as LRT read a script to lead patients on a mental vacation through a meadow.    Education:  Stress Management, Discharge Planning.   Education Outcome: Acknowledges edcuation/In group clarification offered/Needs additional education  Clinical Observations/Feedback: Pt attended group.    Caroll Rancher, LRT/CTRS         Lillia Abed, Aissa Lisowski A 07/10/2017 11:07 AM

## 2017-07-10 NOTE — [facility] Suicide Risk Assessment (Signed)
[facility] Discharge Suicide Risk Assessment   Principal Problem: Substance induced mood disorder [facility]) Discharge Diagnoses:  Patient Active Problem List   Diagnosis Date Noted  . 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 MDD (major depressive disorder), recurrent, severe, with psychosis (HCC) [F33.3] 07/06/2017    Total Time spent with patient: 30 minutes  Musculoskeletal: Strength & Muscle Tone: within normal limits Gait & Station: normal Patient leans: N/A  Psychiatric Specialty Exam: Review of Systems  Constitutional: Negative.   HENT: Negative.   Eyes: Negative.   Respiratory: Negative.   Cardiovascular: Negative.   Gastrointestinal: Negative.   Genitourinary: Negative.   Musculoskeletal: Negative.   Skin: Negative.   Neurological: Negative.   Endo/Heme/Allergies: Negative.   Psychiatric/Behavioral: Negative for depression, hallucinations, memory loss and suicidal ideas. The patient is not nervous/anxious and does not have insomnia.     Blood pressure 124/76, pulse (!) 59, temperature (!) 97.3 F (36.3 C), temperature source Oral, resp. rate 16, height  (1.88 m), weight 72.6 kg (160 lb).Body mass index is 20.54 kg/m.  General Appearance: Neatly dressed, pleasant, engaging well and cooperative. Appropriate behavior. Not in any distress. Good relatedness. Not internally stimulated  Eye Contact::  Good  Speech:  Spontaneous, normal prosody. Normal tone and rate.   Volume:  Normal  Mood:  Euthymic  Affect:  Appropriate and Full Range  Thought Process:  Linear  Orientation:  Full (Time, Place, and Person)  Thought Content:  No delusional theme. No preoccupation with violent thoughts. No negative ruminations. No obsession.  No hallucination in any modality.   Suicidal Thoughts:  No  Homicidal Thoughts:  No  Memory:  Immediate;   Good Recent;   Good Remote;   Good  Judgement:  Good  Insight:  Good  Psychomotor Activity:  Normal  Concentration:  Good   Recall:  Good  Fund of Knowledge:Good  Language: Good  Akathisia:  Negative  Handed:    AIMS (if indicated):     Assets:  Communication Skills Desire for Improvement Housing Intimacy Physical Health Resilience Social Support  Sleep:  Number of Hours: 6  Cognition: WNL  ADL's:  Intact   Clinical Assessment::   31 y.o AAMsingle, employed, lives with his mom and sisters. Brought to the ER by his mom. Intoxicated with alcohol and THC. BAL was 393 mg/dl. Expressed suicidal thoughts and thoughts of hurting others. Reported to have expressed thoughts of drinking himself to death. Reported to have expressed rage towards anyone who gets into his way. Routine labs was essentially normal.  Seen today. No craving for substances. No residual withdrawal symptoms.  Reports that he is in good spirits. Not feeling depressed. Reports normal energy and interest. Has been maintaining normal biological functions. He is able to think clearly. He is able to focus on task. His thoughts are not crowded or racing. No evidence of mania. No hallucination in any modality. He is not making any delusional statement. No passivity of will/thought. He is fully in touch with reality. No thoughts of suicide. No thoughts of homicide. No violent thoughts. No overwhelming anxiety. No acces to weapons.   Nursing staff reports that patient has been appropriate on the unit. Patient has been interacting well with peers. No behavioral issues. Patient has not voiced any suicidal thoughts. Patient has not been observed to be internally stimulated. Patient has been adherent with treatment recommendations. Patient has been tolerating their medication well.   Patient was discussed at team. Team members feels that patient  is back to his baseline level of function. Team agrees with plan to discharge patient today.    Demographic Factors:  NA  Loss Factors: NA  Historical Factors: Impulsivity  Risk Reduction Factors:    Responsible for children under 42 years of age, Sense of responsibility to family, Employed, Living with another person, especially a relative, Positive social support, Positive therapeutic relationship and Positive coping skills or problem solving skills  Continued Clinical Symptoms:  As above   Cognitive Features That Contribute To Risk:  None    Suicide Risk:  Minimal: No identifiable suicidal ideation.  Patient is not having any thoughts of suicide at this time. Modifiable risk factors targeted during this admission includes mood swings and substance use. Demographical and historical risk factors cannot be modified. Patient is now engaging well. Patient is reliable and is future oriented. We have buffered patient's support structures. At this point, patient is at low risk of suicide. Patient is aware of the effects of psychoactive substances on decision making process. Patient has been provided with emergency contacts. Patient acknowledges to use resources provided if unforseen circumstances changes their current risk stratification.    Follow-up Information    Services, Alcohol And Drug Follow up.   Specialty:  Behavioral Health Why:  Please walk-in within 3 days of hospital discharge to be assessed for outpatient mental health services including: medication management and substance abuse intensive outpatient treatment. Walk in hours: Monday, Wednesday, and Friday between 12:30-3PM.  Contact information: [address] [place] [address] [phone]           Plan Of Care/Follow-up recommendations:  1. Continue current psychotropic medications 2. Mental health and addiction follow up as arranged.  3. Discharge in care of his family 4. Provided limited quantity of prescriptions   Georgiann Cocker, MD 07/10/2017, 10:20 AM

## 2019-05-13 ENCOUNTER — Encounter (HOSPITAL_COMMUNITY): Payer: Self-pay | Admitting: Emergency Medicine

## 2019-05-13 ENCOUNTER — Emergency Department (HOSPITAL_COMMUNITY)
Admission: EM | Admit: 2019-05-13 | Discharge: 2019-05-13 | Disposition: A | Discharge status: Home / Self Care | Payer: HRSA Program | Attending: Emergency Medicine | Admitting: Emergency Medicine

## 2019-05-13 DIAGNOSIS — R05 Cough: Secondary | ICD-10-CM | POA: Insufficient documentation

## 2019-05-13 DIAGNOSIS — Z20828 Contact with and (suspected) exposure to other viral communicable diseases: Secondary | ICD-10-CM | POA: Diagnosis not present

## 2019-05-13 DIAGNOSIS — F1290 Cannabis use, unspecified, uncomplicated: Secondary | ICD-10-CM | POA: Diagnosis not present

## 2019-05-13 DIAGNOSIS — F17210 Nicotine dependence, cigarettes, uncomplicated: Secondary | ICD-10-CM | POA: Diagnosis not present

## 2019-05-13 DIAGNOSIS — R0981 Nasal congestion: Secondary | ICD-10-CM | POA: Diagnosis not present

## 2019-05-13 LAB — SARS CORONAVIRUS 2 BY RT PCR (HOSPITAL ORDER, PERFORMED IN ~~LOC~~ HOSPITAL LAB): SARS Coronavirus 2: NEGATIVE

## 2019-05-13 MED ORDER — ALBUTEROL SULFATE HFA 108 (90 BASE) MCG/ACT IN AERS
1.0000 | INHALATION_SPRAY | Freq: Once | RESPIRATORY_TRACT | Status: AC
  Administered 2019-05-13: 1 via RESPIRATORY_TRACT
  Filled 2019-05-13: qty 6.7

## 2019-05-13 NOTE — ED Provider Notes (Signed)
[facility] Provider Note   CSN: [id number] Arrival date & time: 05/13/19  0740    History   Chief Complaint Chief Complaint  Patient presents with  . Nasal Congestion    HPI Seth Nolan is a 33 y.o. male with history of alcohol abuse and marijuana abuse presents for evaluation of acute onset, persistent nasal congestion beginning today.  He reports that someone at work was sent home with COVID symptoms and he is unsure if he was exposed to her.  Reports chronic intermittent shortness of breath due to history of asthma but does not use an inhaler.  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  Denies any chest pains.  Reports chronic cough of brown sputum due to smoking marijuana daily and reports no change in his chronic shortness of breath and cough.  Denies fever, abdominal pain, nausea, or vomiting.  Has not tried anything for his symptoms.  Reports that he is here for a COVID test.     The history is provided by the patient.    Past Medical History:  Diagnosis Date  . Acute alcoholic gastritis without hemorrhage 08/2016  . ETOH abuse   . Marijuana abuse     Patient Active Problem List   Diagnosis Date Noted  . Substance induced mood disorder (HCC) 07/07/2017  . 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      Home Medications    Prior to Admission medications   Medication Sig Start Date End Date Taking? Authorizing Provider  hydrOXYzine (ATARAX/VISTARIL) 25 MG tablet Take 1 tablet (25 mg total) by mouth every 6 (six) hours as needed for anxiety. 07/10/17   Armandina StammerNwoko, Agnes I, NP  naltrexone (DEPADE) 50 MG tablet Take 1 tablet (50 mg total) by mouth daily. For alcoholism 07/11/17   Armandina StammerNwoko, Agnes I, NP  nicotine polacrilex (NICORETTE) 2 MG gum Take 1 each (2 mg total) by mouth as needed for smoking  cessation. (May buy from over the counter at the pharmacy): 07/10/17   Armandina StammerNwoko, Agnes I, NP  traZODone (DESYREL) 50 MG tablet Take 1 tablet (50 mg total) by mouth at bedtime as needed for sleep. 07/10/17   Sanjuana KavaNwoko, Agnes I, NP    Family History No family history on file.  Social History Social History   Tobacco Use  . Smoking status: Current Every Day Smoker    Packs/day: 0.25    Types: Cigarettes  . Smokeless tobacco: Never Used  Substance Use Topics  . Alcohol use: Yes    Alcohol/week: 2.0 standard drinks    Types: 2 Cans of beer per week  . Drug use: Yes    Frequency: 7.0 times per week    Types: Marijuana    Comment: pt states whenever he is bored/daily     Allergies   Patient has no known allergies.   Review of Systems Review of Systems  Constitutional: Negative for fever.  HENT: Positive for congestion. Negative for trouble swallowing.   Respiratory: Positive for cough (chronic, unchanged).   Cardiovascular: Negative for chest pain.  Gastrointestinal: Negative for abdominal pain, diarrhea, nausea and vomiting.  Musculoskeletal: Negative for neck stiffness.  All other systems reviewed and are negative.    Physical Exam Updated Vital Signs BP 124/60 (BP Location: Right Arm)   Pulse 70   Temp 98.2 F (36.8 C) (Oral)  Resp 16   SpO2 100%   Physical Exam Vitals signs and nursing note reviewed.  Constitutional:      General: He is not in acute distress.    Appearance: He is well-developed.     Comments: Resting comfortably in chair, overall well-appearing  HENT:     Head: Normocephalic and atraumatic.     Nose: Congestion present.  Eyes:     General:        Right eye: No discharge.        Left eye: No discharge.     Conjunctiva/sclera: Conjunctivae normal.  Neck:     Vascular: No JVD.     Trachea: No tracheal deviation.  Cardiovascular:     Rate and Rhythm: Normal rate and regular rhythm.  Pulmonary:     Effort: Pulmonary effort is normal.     Breath  sounds: Normal breath sounds.     Comments: Speaking in full sentences without difficulty, SPO2 saturations 100% on room air. Abdominal:     General: Bowel sounds are normal. There is no distension.     Palpations: Abdomen is soft.     Tenderness: There is no abdominal tenderness. There is no guarding or rebound.  Skin:    General: Skin is warm and dry.     Findings: No erythema.  Neurological:     Mental Status: He is alert.  Psychiatric:        Behavior: Behavior normal.      ED Treatments / Results  Labs (all labs ordered are listed, but only abnormal results are displayed) Labs Reviewed  SARS CORONAVIRUS 2 (HOSPITAL ORDER, [facility]FOR[facility])    EKG None  Radiology No results found.  Procedures Procedures (including critical care time)  Medications Ordered in ED Medications  albuterol (VENTOLIN HFA) 108 (90 Base) MCG/ACT inhaler 1 puff (has no administration in time range)     Initial Impression / Assessment and Plan / ED Course  I have reviewed the triage vital signs and the nursing notes.  Pertinent labs & imaging results that were available during my care of the patient were reviewed by me and considered in my medical decision making (see chart for details).        Seth Sena SlateDumas was evaluated in Emergency Department on 05/13/2019 for the symptoms described in the history of present illness. He was evaluated in the context of the global COVID-19 pandemic, which necessitated consideration that the patient might be at risk for infection with the SARS-CoV-2 virus that causes COVID-19. Institutional protocols and algorithms that pertain to the evaluation of patients at risk for COVID-19 are in a state of rapid change based on information released by regulatory bodies including the CDC and federal and state organizations. These policies and algorithms were followed during the patient's care in the ED.  Patient with complaint of nasal congestion  present for COVID testing today.  He is afebrile, vital signs are stable.  He is nontoxic in appearance.  SPO2 saturations are stable and he shows no signs of respiratory distress.  Lungs are clear bilaterally.  He does report history of asthma and does not use an inhaler for this so we will give him 1 to take home as needed for shortness of breath and wheezing.  Doubt pneumonia in the absence of fever, adventitious breath sounds, or new cough.  He has chronic cough due to marijuana smoking.  Will obtain COVID send out test, he understands that his results will not  return for another 48 to 72 hours.  Discussed quarantining at home and communicating with his employer so that he may quarantine appropriately for current CDC guidelines.  Recommend follow-up with PCP for reevaluation of symptoms.  Discussed strict ED return precautions. Patient verbalized understanding of and agreement with plan and is safe for discharge home at this time.  Final Clinical Impressions(s) / ED Diagnoses   Final diagnoses:  Nasal congestion    ED Discharge Orders    None       Debroah Baller 05/13/19 7096    Dorie Rank, MD 05/14/19 [phone]

## 2019-05-13 NOTE — Discharge Instructions (Signed)
Use the albuterol inhaler 1 to 2 puffs every 4-6 hours as needed for shortness of breath.  Use Flonase for nasal congestion.  Drink plenty of fluids and get plenty of rest.  You can take Tylenol or ibuprofen as needed for fever.  Your COVID test will result in the next 48 hours or so.  You can see your results on my chart.  You will receive a phone call if your test is positive but no phone call if your test is negative.   If your test is positive you should quarantine at home for at least 10 to 14 days from symptom onset and at least 3 days fever free without the use of ibuprofen or Tylenol.  If your test is negative, call your employer to follow their protocols.  I would recommend quarantining at home for at least 10 days given that you have symptoms and had a potential exposure to COVID-19.  I would not recommend going outside or interacting with other people.  Return to the emergency department if any concerning signs or symptoms develop such as shortness of breath, high fevers, persistent vomiting, severe chest pains, or loss of consciousness.

## 2019-05-13 NOTE — ED Triage Notes (Signed)
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 Pt denies any other sxs.

## 2019-06-21 ENCOUNTER — Other Ambulatory Visit: Payer: Self-pay

## 2019-06-21 ENCOUNTER — Encounter (HOSPITAL_COMMUNITY): Payer: Self-pay | Admitting: Urgent Care

## 2019-06-21 ENCOUNTER — Ambulatory Visit (HOSPITAL_COMMUNITY)
Admission: EM | Admit: 2019-06-21 | Discharge: 2019-06-21 | Disposition: A | Discharge status: Home / Self Care | Payer: Self-pay | Attending: Family Medicine | Admitting: Family Medicine

## 2019-06-21 DIAGNOSIS — K0889 Other specified disorders of teeth and supporting structures: Secondary | ICD-10-CM

## 2019-06-21 DIAGNOSIS — K047 Periapical abscess without sinus: Secondary | ICD-10-CM

## 2019-06-21 MED ORDER — AMOXICILLIN-POT CLAVULANATE 875-125 MG PO TABS: 1.0000 | ORAL_TABLET | Freq: Two times a day (BID) | ORAL | 0 refills | Status: DC

## 2019-06-21 MED ORDER — NAPROXEN 500 MG PO TABS: 500.0000 mg | ORAL_TABLET | Freq: Two times a day (BID) | ORAL | 0 refills | Status: DC

## 2019-06-21 NOTE — ED Provider Notes (Signed)
  MRN: [id number] DOB: 1986-04-19  Subjective:   Seth Nolan is a 33 y.o. male presenting for 2-day history of cute onset worsening left-sided lower dental pain with associated facial swelling and pain of same side.  Patient states that his symptoms are getting worse, not responding to over-the-counter pain medication.  He has already reached out to several dental practices and is trying to get an appointment this week.   He is not currently taking any medications.   No Known Allergies   Past Medical History:  Diagnosis Date  . Acute alcoholic gastritis without hemorrhage 08/2016  . ETOH abuse   . Marijuana abuse      History reviewed. No pertinent surgical history.   ROS  Objective:   Vitals: BP 136/84 (BP Location: Right Arm)   Pulse 89   Temp 98.6 F (37 C) (Oral)   Resp 18   SpO2 99%   Physical Exam Constitutional:      General: He is not in acute distress.    Appearance: Normal appearance. He is well-developed and normal weight. He is not ill-appearing.  HENT:     Head: Normocephalic and atraumatic.      Right Ear: Tympanic membrane, ear canal and external ear normal. There is no impacted cerumen.     Left Ear: Tympanic membrane, ear canal and external ear normal. There is no impacted cerumen.     Nose: Nose normal. No congestion or rhinorrhea.     Mouth/Throat:     Mouth: Mucous membranes are moist.     Pharynx: Oropharynx is clear. No oropharyngeal exudate or posterior oropharyngeal erythema.   Eyes:     General: No scleral icterus.       Right eye: No discharge.        Left eye: No discharge.     Extraocular Movements: Extraocular movements intact.     Conjunctiva/sclera: Conjunctivae normal.     Pupils: Pupils are equal, round, and reactive to light.  Neck:     Musculoskeletal: Normal range of motion and neck supple. No neck rigidity or muscular tenderness.  Cardiovascular:     Rate and Rhythm: Normal rate.  Pulmonary:     Effort: Pulmonary  effort is normal.  Neurological:     General: No focal deficit present.     Mental Status: He is alert and oriented to person, place, and time.  Psychiatric:        Mood and Affect: Mood normal.        Behavior: Behavior normal.     Assessment and Plan :   1. Dental infection   2. Pain, dental     Start Augmentin for dental abscess, use naproxen for pain and inflammation. Emphasized need for dental surgeon consult. Counseled patient on potential for adverse effects with medications prescribed/recommended today, strict ER and return-to-clinic precautions discussed, patient verbalized understanding.    Jaynee Eagles, PA-C 06/21/19 1046

## 2019-06-21 NOTE — ED Triage Notes (Signed)
Pt sts left sided dental pain with swelling 

## 2019-06-21 NOTE — Discharge Instructions (Addendum)
[facility] [phone] extension [phone]  Dr. Civils [phone] [address]  [facility] [phone] [address]  [facility] [phone] extension [phone] [place], [place], [address] First come first serve for the first 10 clients.  May do simple extractions only, no wisdom teeth or surgery.  You may try the second for Thursday of the month starting at 6:30 AM.  [employer] You may call the school to see if they are still helping to provide dental care for emergent cases.  

## 2019-09-24 ENCOUNTER — Ambulatory Visit (HOSPITAL_COMMUNITY)
Admission: EM | Admit: 2019-09-24 | Discharge: 2019-09-24 | Disposition: A | Discharge status: Home / Self Care | Payer: Self-pay | Attending: Physician Assistant | Admitting: Physician Assistant

## 2019-09-24 ENCOUNTER — Other Ambulatory Visit: Payer: Self-pay

## 2019-09-24 ENCOUNTER — Encounter (HOSPITAL_COMMUNITY): Payer: Self-pay

## 2019-09-24 DIAGNOSIS — Z202 Contact with and (suspected) exposure to infections with a predominantly sexual mode of transmission: Secondary | ICD-10-CM

## 2019-09-24 DIAGNOSIS — Z113 Encounter for screening for infections with a predominantly sexual mode of transmission: Secondary | ICD-10-CM | POA: Insufficient documentation

## 2019-09-24 NOTE — Discharge Instructions (Signed)
Abstain from sex until results are known. Labs take between 2 - 5 days for results to return, check mychart for results.

## 2019-09-24 NOTE — ED Provider Notes (Signed)
[facility]    CSN: [id number] Arrival date & time: 09/24/19  1143      History   Chief Complaint Chief Complaint  Patient presents with  . STD Testing    HPI Seth Nolan is a 33 y.o. male.   Patient here requesting STD testing.  He denies any sx, however, he was looking through his GFs phone while she was sleeping and noticed that she got STD testing.  He does not know results and she does not know he knows about the test.       Past Medical History:  Diagnosis Date  . Acute alcoholic gastritis without hemorrhage 08/2016  . ETOH abuse   . Marijuana abuse     Patient Active Problem List   Diagnosis Date Noted  . Substance induced mood disorder ([place]) 07/07/2017  . MDD (major depressive disorder), recurrent, severe, with psychosis ([place]) 07/06/2017    History reviewed. No pertinent surgical history.     Home Medications    Prior to Admission medications   Medication Sig Start Date End Date Taking? Authorizing Provider  amoxicillin-clavulanate (AUGMENTIN) 875-125 MG tablet Take 1 tablet by mouth every 12 (twelve) hours. 06/21/19   Jaynee Eagles, PA-C  naproxen (NAPROSYN) 500 MG tablet Take 1 tablet (500 mg total) by mouth 2 (two) times daily. 06/21/19   Jaynee Eagles, PA-C  traZODone (DESYREL) 50 MG tablet Take 1 tablet (50 mg total) by mouth at bedtime as needed for sleep. 07/10/17 06/21/19  Encarnacion Slates, NP    Family History Family History  Family history unknown: Yes    Social History Social History   Tobacco Use  . Smoking status: Current Every Day Smoker    Packs/day: 0.25    Types: Cigarettes  . Smokeless tobacco: Never Used  Substance Use Topics  . Alcohol use: Yes    Alcohol/week: 2.0 standard drinks    Types: 2 Cans of beer per week  . Drug use: Yes    Frequency: 7.0 times per week    Types: Marijuana    Comment: pt states whenever he is bored/daily     Allergies   Patient has no known allergies.   Review of Systems Review of  Systems  Constitutional: Negative for chills, fatigue and fever.  Gastrointestinal: Negative for abdominal pain, nausea and vomiting.  Genitourinary: Negative for difficulty urinating, discharge, dysuria, flank pain, frequency, genital sores, hematuria, penile pain, penile swelling, scrotal swelling, testicular pain and urgency.  Musculoskeletal: Negative for back pain.  Skin: Negative for color change and rash.  Neurological: Negative for dizziness.  Hematological: Negative for adenopathy. Does not bruise/bleed easily.  Psychiatric/Behavioral: Negative for agitation and sleep disturbance.     Physical Exam Triage Vital Signs ED Triage Vitals  Enc Vitals Group     BP 09/24/19 1242 119/71     Pulse Rate 09/24/19 1242 69     Resp 09/24/19 1242 18     Temp 09/24/19 1242 98.3 F (36.8 C)     Temp Source 09/24/19 1242 Oral     SpO2 09/24/19 1242 95 %     Weight --      Height --      Head Circumference --      Peak Flow --      Pain Score 09/24/19 1243 0     Pain Loc --      Pain Edu? --      Excl. in [place]? --    No data found.  Updated  Vital Signs BP 119/71 (BP Location: Left Arm)   Pulse 69   Temp 98.3 F (36.8 C) (Oral)   Resp 18   SpO2 95%   Visual Acuity Right Eye Distance:   Left Eye Distance:   Bilateral Distance:    Right Eye Near:   Left Eye Near:    Bilateral Near:     Physical Exam Vitals and nursing note reviewed.  Constitutional:      Appearance: Normal appearance. He is well-developed.  HENT:     Head: Normocephalic and atraumatic.     Nose: Nose normal.  Eyes:     General: No scleral icterus.    Extraocular Movements: Extraocular movements intact.     Conjunctiva/sclera: Conjunctivae normal.     Pupils: Pupils are equal, round, and reactive to light.  Cardiovascular:     Rate and Rhythm: Normal rate and regular rhythm.     Heart sounds: No murmur.  Pulmonary:     Effort: Pulmonary effort is normal. No respiratory distress.     Breath sounds:  Normal breath sounds.  Abdominal:     Palpations: Abdomen is soft.     Tenderness: There is no abdominal tenderness. There is no right CVA tenderness, left CVA tenderness, guarding or rebound.  Genitourinary:    Penis: Normal. No tenderness, discharge or lesions.      Testes: Normal.        Right: Tenderness or swelling not present.        Left: Tenderness or swelling not present.  Musculoskeletal:     Cervical back: Neck supple.  Skin:    General: Skin is warm and dry.     Capillary Refill: Capillary refill takes less than 2 seconds.  Neurological:     General: No focal deficit present.     Mental Status: He is alert and oriented to person, place, and time.  Psychiatric:        Mood and Affect: Mood normal.        Behavior: Behavior normal.      UC Treatments / Results  Labs (all labs ordered are listed, but only abnormal results are displayed) Labs Reviewed  CYTOLOGY, (ORAL, ANAL, URETHRAL) ANCILLARY ONLY    EKG   Radiology No results found.  Procedures Procedures (including critical care time)  Medications Ordered in UC Medications - No data to display  Initial Impression / Assessment and Plan / UC Course  I have reviewed the triage vital signs and the nursing notes.  Pertinent labs & imaging results that were available during my care of the patient were reviewed by me and considered in my medical decision making (see chart for details).      Final Clinical Impressions(s) / UC Diagnoses   Final diagnoses:  Screen for STD (sexually transmitted disease)     Discharge Instructions     Abstain from sex until results are known. Labs take between 2 - 5 days for results to return, check mychart for results.    ED Prescriptions    None     PDMP not reviewed this encounter.   Evern Core, PA-C 09/24/19 1321

## 2019-09-24 NOTE — ED Triage Notes (Signed)
Pt presents for STD testing after possible exposure; Pt states he is not having any symptoms.

## 2019-09-27 LAB — CYTOLOGY, (ORAL, ANAL, URETHRAL) ANCILLARY ONLY
Chlamydia: NEGATIVE
Neisseria Gonorrhea: NEGATIVE

## 2020-01-11 ENCOUNTER — Emergency Department (HOSPITAL_COMMUNITY)
Admission: EM | Admit: 2020-01-11 | Discharge: 2020-01-11 | Disposition: A | Discharge status: Home / Self Care | Payer: Self-pay | Attending: Emergency Medicine | Admitting: Emergency Medicine

## 2020-01-11 ENCOUNTER — Emergency Department (HOSPITAL_COMMUNITY): Payer: Self-pay

## 2020-01-11 ENCOUNTER — Other Ambulatory Visit: Payer: Self-pay

## 2020-01-11 DIAGNOSIS — R519 Headache, unspecified: Secondary | ICD-10-CM | POA: Insufficient documentation

## 2020-01-11 DIAGNOSIS — Y92481 Parking lot as the place of occurrence of the external cause: Secondary | ICD-10-CM | POA: Insufficient documentation

## 2020-01-11 DIAGNOSIS — S50312A Abrasion of left elbow, initial encounter: Secondary | ICD-10-CM

## 2020-01-11 DIAGNOSIS — F17210 Nicotine dependence, cigarettes, uncomplicated: Secondary | ICD-10-CM | POA: Insufficient documentation

## 2020-01-11 DIAGNOSIS — M25522 Pain in left elbow: Secondary | ICD-10-CM | POA: Insufficient documentation

## 2020-01-11 DIAGNOSIS — R5383 Other fatigue: Secondary | ICD-10-CM | POA: Insufficient documentation

## 2020-01-11 DIAGNOSIS — Y93I9 Activity, other involving external motion: Secondary | ICD-10-CM | POA: Insufficient documentation

## 2020-01-11 DIAGNOSIS — S060X9A Concussion with loss of consciousness of unspecified duration, initial encounter: Secondary | ICD-10-CM

## 2020-01-11 DIAGNOSIS — Y999 Unspecified external cause status: Secondary | ICD-10-CM | POA: Insufficient documentation
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
## 2020-01-11 NOTE — ED Notes (Signed)
Pt verbalized understanding of discharge instructions. Follow up care and pain management reviewed, pt had no further questions. 
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
## 2020-01-11 NOTE — Discharge Instructions (Signed)
You may take Tylenol or ibuprofen, available over-the-counter according to label instructions as needed for pain.
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
## 2020-08-15 ENCOUNTER — Emergency Department (HOSPITAL_COMMUNITY): Payer: Self-pay

## 2020-08-15 ENCOUNTER — Emergency Department (HOSPITAL_COMMUNITY)
Admission: EM | Admit: 2020-08-15 | Discharge: 2020-08-15 | Disposition: A | Discharge status: Home / Self Care | Payer: Self-pay | Attending: Emergency Medicine | Admitting: Emergency Medicine

## 2020-08-15 ENCOUNTER — Other Ambulatory Visit: Payer: Self-pay

## 2020-08-15 ENCOUNTER — Encounter (HOSPITAL_COMMUNITY): Payer: Self-pay | Admitting: Emergency Medicine

## 2020-08-15 DIAGNOSIS — S9781XA Crushing injury of right foot, initial encounter: Secondary | ICD-10-CM | POA: Insufficient documentation

## 2020-08-15 DIAGNOSIS — F17210 Nicotine dependence, cigarettes, uncomplicated: Secondary | ICD-10-CM | POA: Insufficient documentation

## 2020-08-15 DIAGNOSIS — M25571 Pain in right ankle and joints of right foot: Secondary | ICD-10-CM

## 2020-08-15 DIAGNOSIS — W231XXA Caught, crushed, jammed, or pinched between stationary objects, initial encounter: Secondary | ICD-10-CM | POA: Insufficient documentation

## 2020-08-15 DIAGNOSIS — Y9289 Other specified places as the place of occurrence of the external cause: Secondary | ICD-10-CM | POA: Insufficient documentation

## 2020-08-15 MED ORDER — OXYCODONE-ACETAMINOPHEN 5-325 MG PO TABS
1.0000 | ORAL_TABLET | Freq: Once | ORAL | Status: AC
  Administered 2020-08-15: 1 via ORAL
  Filled 2020-08-15: qty 1

## 2020-08-15 NOTE — ED Triage Notes (Signed)
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

## 2020-08-15 NOTE — ED Provider Notes (Signed)
[facility] EMERGENCY DEPARTMENT Provider Note   CSN: [id number] Arrival date & time: 08/15/20  1517     History Chief Complaint  Patient presents with  . Foot Pain    Seth Nolan is a 34 y.o. male.  The history is provided by the patient and medical records. No language interpreter was used.  Foot Injury Location:  Foot and ankle Time since incident:  1 day Injury: yes   Mechanism of injury: crush   Crush:    Mechanism:  Falling object Ankle location:  R ankle Foot location:  R foot Pain details:    Quality:  Aching   Radiates to:  Does not radiate   Severity:  Severe   Onset quality:  Gradual   Timing:  Constant   Progression:  Worsening Chronicity:  New Tetanus status:  Unknown Prior injury to area:  No Relieved by:  Nothing Worsened by:  Bearing weight, extension and flexion Associated symptoms: no back pain, no fatigue, no fever and no neck pain        Past Medical History:  Diagnosis Date  . Acute alcoholic gastritis without hemorrhage 08/2016  . ETOH abuse   . Marijuana abuse     Patient Active Problem List   Diagnosis Date Noted  . Substance induced mood disorder (HCC) 07/07/2017  . 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 No pertinent surgical history.     Family History  Family history unknown: Yes    Social History   Tobacco Use  . Smoking status: Current Every Day Smoker    Packs/day: 0.25    Types: Cigarettes  . Smokeless tobacco: Never Used  Substance Use Topics  . Alcohol use: Yes    Alcohol/week: 2.0 standard drinks    Types: 2 Cans of beer per week  . Drug use: Yes    Frequency: 7.0 times per week    Types: Marijuana    Comment: pt states whenever he is bored/daily    Home Medications Prior to Admission medications   Medication Sig Start Date End Date Taking? Authorizing Provider  amoxicillin-clavulanate (AUGMENTIN) 875-125 MG  tablet Take 1 tablet by mouth every 12 (twelve) hours. 06/21/19   Wallis Bamberg, PA-C  naproxen (NAPROSYN) 500 MG tablet Take 1 tablet (500 mg total) by mouth 2 (two) times daily. 06/21/19   Wallis Bamberg, PA-C  traZODone (DESYREL) 50 MG tablet Take 1 tablet (50 mg total) by mouth at bedtime as needed for sleep. 07/10/17 06/21/19  Sanjuana Kava, NP    Allergies    Patient has no known allergies.  Review of Systems   Review of Systems  Constitutional: Negative for chills, diaphoresis, fatigue and fever.  Respiratory: Negative for shortness of breath.   Cardiovascular: Negative for chest pain.  Gastrointestinal: Negative for abdominal pain.  Musculoskeletal: Negative for back pain and neck pain.  Skin: Negative for rash and wound.  Neurological: Negative for headaches.  All other systems reviewed and are negative.   Physical Exam Updated Vital Signs BP 121/78 (BP Location: Left Arm)   Pulse 90   Temp 98.5 F (36.9 C) (Oral)   Resp 16   SpO2 98%   Physical Exam Vitals and nursing note reviewed.  Constitutional:      General: He is not in acute distress.    Appearance: He is well-developed. He is not ill-appearing, toxic-appearing or diaphoretic.  HENT:     Head: Normocephalic and atraumatic.  Nose: Nose normal.  Eyes:     Conjunctiva/sclera: Conjunctivae normal.  Cardiovascular:     Rate and Rhythm: Normal rate and regular rhythm.     Heart sounds: No murmur heard.   Pulmonary:     Effort: Pulmonary effort is normal. No respiratory distress.     Breath sounds: Normal breath sounds. No wheezing, rhonchi or rales.  Chest:     Chest wall: No tenderness.  Abdominal:     Palpations: Abdomen is soft.     Tenderness: There is no abdominal tenderness.  Musculoskeletal:        General: Tenderness and signs of injury present.     Right lower leg: No edema.     Left lower leg: No edema.     Right ankle: Tenderness present.     Right Achilles Tendon: No tenderness.     Right foot:  Normal range of motion. Tenderness present. No deformity or crepitus.       Legs:  Skin:    General: Skin is warm and dry.     Capillary Refill: Capillary refill takes less than 2 seconds.     Findings: No erythema or rash.  Neurological:     General: No focal deficit present.     Mental Status: He is alert.     Sensory: No sensory deficit.     Motor: No weakness.  Psychiatric:        Mood and Affect: Mood normal.     ED Results / Procedures / Treatments   Labs (all labs ordered are listed, but only abnormal results are displayed) Labs Reviewed - No data to display  EKG None  Radiology DG Ankle Complete Right  Result Date: 08/15/2020 CLINICAL DATA:  34 year old male with a history occupational injury EXAM: RIGHT ANKLE - COMPLETE 3+ VIEW COMPARISON:  None. FINDINGS: There is no evidence of fracture, dislocation, or joint effusion. There is no evidence of arthropathy or other focal bone abnormality. Soft tissues are unremarkable. IMPRESSION: Negative. Electronically Signed   By: Gilmer Mor D.O.   On: 08/15/2020 08:31   DG Foot Complete Right  Result Date: 08/15/2020 CLINICAL DATA:  35 year old male with a history of occupational injury yesterday EXAM: RIGHT FOOT COMPLETE - 3+ VIEW COMPARISON:  None. FINDINGS: No acute displaced fracture. No subluxation/dislocation. No significant degenerative changes. No radiopaque foreign body. No soft tissue swelling. IMPRESSION: Negative. Electronically Signed   By: Gilmer Mor D.O.   On: 08/15/2020 08:29    Procedures Procedures (including critical care time)  Medications Ordered in ED Medications  oxyCODONE-acetaminophen (PERCOCET/ROXICET) 5-325 MG per tablet 1 tablet (has no administration in time range)    ED Course  I have reviewed the triage vital signs and the nursing notes.  Pertinent labs & imaging results that were available during my care of the patient were reviewed by me and considered in my medical decision making  (see chart for details).    MDM Rules/Calculators/A&P                          Seth Nolan is a 34 y.o. male with no significant past medical history presents with right foot injury.  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  He reports it hurt mildly initially but then over time continued to worsen.  He reports he has pain when he tries to dorsiflex his ankle and foot.  He has sensation and can wiggle his toes but he reports the pain is severe.  He has been having pain with any type of walking.  He denies any other injury specifically denies any fevers, chills, cough, nausea, vomiting, or other injuries.  There is no lacerations reported.  On exam, patient has tenderness in the dorsum of his foot as well as his ankle.  He did not have tenderness in the knee or shin.  He could dorsiflex and plantarflex his foot with pain.  Normal capillary refill.  No lacerations.  No significant bruising seen initially.  Lungs clear and chest nontender.  Back nontender.  Patient with x-rays of the foot and ankle to look for injuries.  He will be given a pain pill to help with discomfort initially.  Anticipate reassessment after x-rays.  8:52 AM Imaging showed no evidence of fracture or dislocation.  I suspect soft tissue, ligamentous or tendinous injuries.  He likely also sprained his ankle.  He will be placed into a walking boot and areas crutches at the bedside.  He was anti-inflammatory medicines and RICE therapy.  He will be given a work note and orthopedic follow-up if he continues to give him trouble.  He may need more advanced imaging in the future to further evaluate.  Patient agrees with plan of care and will discharge for outpatient follow-up.    Final Clinical Impression(s) / ED Diagnoses Final diagnoses:  Crushing injury of right foot, initial encounter  Acute right ankle pain    Rx / DC Orders ED Discharge  Orders    None     Clinical Impression: 1. Crushing injury of right foot, initial encounter   2. Acute right ankle pain     Disposition: Discharge  Condition: Good  I have discussed the results, Dx and Tx plan with the pt(& family if present). He/she/they expressed understanding and agree(s) with the plan. Discharge instructions discussed at great length. Strict return precautions discussed and pt &/or family have verbalized understanding of the instructions. No further questions at time of discharge.    New Prescriptions   No medications on file    Follow Up: [address] [place] [address] [phone]     [facility] EMERGENCY DEPARTMENT [address] [id number] mc [place] [address] [phone]    [facility] [address] [place] [address] [phone] Schedule an appointment as soon as possible for a visit       Bedelia Pong, Canary Brim, MD 08/15/20 [phone]

## 2020-08-15 NOTE — Progress Notes (Signed)
Orthopedic Tech Progress Note Patient Details:  Seth Nolan 1986/04/07 [id number]  Ortho Devices Type of Ortho Device: CAM walker Ortho Device/Splint Location: RLE Ortho Device/Splint Interventions: Ordered, Application, Adjustment   Post Interventions Patient Tolerated: Well Instructions Provided: Care of device   Donald Pore 08/15/2020, 10:21 AM

## 2020-08-15 NOTE — ED Notes (Signed)
Pt given D/C papers. Leaving with family that will be driving. All questions answered.

## 2020-08-15 NOTE — Discharge Instructions (Signed)
Your crush injury today likely injured the soft tissues including tendons and ligaments in the foot and ankle.  It is likely also sprained.  Please use the walking boot L protect your foot and rest and keep it elevated when you can.  Please use ice and anti-inflammatory medications.  Please keep off it for the next few days.  Please follow-up with orthopedics for further follow-up.  If any symptoms change or worsen, please return to the nearest emergency department.

## 2020-10-19 ENCOUNTER — Emergency Department (HOSPITAL_COMMUNITY)
Admission: EM | Admit: 2020-10-19 | Discharge: 2020-10-19 | Disposition: A | Discharge status: Home / Self Care | Payer: HRSA Program | Attending: Emergency Medicine | Admitting: Emergency Medicine

## 2020-10-19 ENCOUNTER — Emergency Department (HOSPITAL_COMMUNITY): Payer: HRSA Program

## 2020-10-19 ENCOUNTER — Other Ambulatory Visit: Payer: Self-pay

## 2020-10-19 ENCOUNTER — Encounter (HOSPITAL_COMMUNITY): Payer: Self-pay | Admitting: Pharmacy Technician

## 2020-10-19 DIAGNOSIS — F17210 Nicotine dependence, cigarettes, uncomplicated: Secondary | ICD-10-CM | POA: Diagnosis not present

## 2020-10-19 DIAGNOSIS — R059 Cough, unspecified: Secondary | ICD-10-CM | POA: Diagnosis present

## 2020-10-19 DIAGNOSIS — U071 COVID-19: Secondary | ICD-10-CM

## 2020-10-19 LAB — BASIC METABOLIC PANEL
Anion gap: 15 (ref 5–15)
BUN: <5 mg/dL — ABNORMAL LOW (ref 6–20)
CO2: 23 mmol/L (ref 22–32)
Calcium: 8.9 mg/dL (ref 8.9–10.3)
Chloride: 102 mmol/L (ref 98–111)
Creatinine, Ser: 0.73 mg/dL (ref 0.61–1.24)
GFR, Estimated: >60 mL/min (ref >60)
Glucose, Bld: 89 mg/dL (ref 70–99)
Potassium: 3.9 mmol/L (ref 3.5–5.1)
Sodium: 140 mmol/L (ref 135–145)

## 2020-10-19 LAB — CBC
HCT: 43.5 % (ref 39.0–52.0)
Hemoglobin: 14.2 g/dL (ref 13.0–17.0)
MCH: 30.7 pg (ref 26.0–34.0)
MCHC: 32.6 g/dL (ref 30.0–36.0)
MCV: 94.2 fL (ref 80.0–100.0)
Platelets: 252 10*3/uL (ref 150–400)
RBC: 4.62 MIL/uL (ref 4.22–5.81)
RDW: 13.4 % (ref 11.5–15.5)
WBC: 2.6 10*3/uL — ABNORMAL LOW (ref 4.0–10.5)
nRBC: 0 % (ref 0.0–0.2)

## 2020-10-19 LAB — RESP PANEL BY RT-PCR (FLU A&B, COVID) ARPGX2
Influenza A by PCR: NEGATIVE
Influenza B by PCR: NEGATIVE
SARS Coronavirus 2 by RT PCR: POSITIVE — AB

## 2020-10-19 LAB — TROPONIN I (HIGH SENSITIVITY): Troponin I (High Sensitivity): 2 ng/L (ref <18)

## 2020-10-19 NOTE — ED Provider Notes (Signed)
[facility] EMERGENCY DEPARTMENT Provider Note   CSN: [id number] Arrival date & time: 10/19/20  1443     History Chief Complaint  Patient presents with  . Covid Exposure    Seth Nolan is a 35 y.o. male.  Patient presents chief complaint of cough congestion body aches.  He states that his daughter is positive for Covid.  Patient states he has had symptoms of disease for the past 7 to 10 days.  Denies vomiting or diarrhea.        Past Medical History:  Diagnosis Date  . Acute alcoholic gastritis without hemorrhage 08/2016  . ETOH abuse   . Marijuana abuse     Patient Active Problem List   Diagnosis Date Noted  . Substance induced mood disorder (HCC) 07/07/2017  . 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 No pertinent surgical history.     Family History  Family history unknown: Yes    Social History   Tobacco Use  . Smoking status: Current Every Day Smoker    Packs/day: 0.25    Types: Cigarettes  . Smokeless tobacco: Never Used  Substance Use Topics  . Alcohol use: Yes    Alcohol/week: 2.0 standard drinks    Types: 2 Cans of beer per week  . Drug use: Yes    Frequency: 7.0 times per week    Types: Marijuana    Comment: pt states whenever he is bored/daily    Home Medications Prior to Admission medications   Medication Sig Start Date End Date Taking? Authorizing Provider  amoxicillin-clavulanate (AUGMENTIN) 875-125 MG tablet Take 1 tablet by mouth every 12 (twelve) hours. 06/21/19   Wallis Bamberg, PA-C  naproxen (NAPROSYN) 500 MG tablet Take 1 tablet (500 mg total) by mouth 2 (two) times daily. 06/21/19   Wallis Bamberg, PA-C  traZODone (DESYREL) 50 MG tablet Take 1 tablet (50 mg total) by mouth at bedtime as needed for sleep. 07/10/17 06/21/19  Sanjuana Kava, NP    Allergies    Patient has no known allergies.  Review of Systems   Review of Systems  Constitutional: Positive  for fever.  HENT: Negative for ear pain and sore throat.   Eyes: Negative for pain.  Respiratory: Positive for cough.   Cardiovascular: Negative for chest pain.  Gastrointestinal: Negative for abdominal pain.  Genitourinary: Negative for flank pain.  Musculoskeletal: Negative for back pain.  Skin: Negative for color change and rash.  Neurological: Negative for syncope.  All other systems reviewed and are negative.   Physical Exam Updated Vital Signs BP 126/80 (BP Location: Right Arm)   Pulse 68   Temp 98.1 F (36.7 C) (Oral)   Resp 16   Ht 6\' 2"  (1.88 m)   Wt 72.6 kg   SpO2 97%   BMI 20.54 kg/m   Physical Exam Constitutional:      General: He is not in acute distress.    Appearance: He is well-developed.  HENT:     Head: Normocephalic.     Nose: Nose normal.  Eyes:     Extraocular Movements: Extraocular movements intact.  Cardiovascular:     Rate and Rhythm: Normal rate.  Pulmonary:     Effort: Pulmonary effort is normal.  Skin:    Coloration: Skin is not jaundiced.  Neurological:     Mental Status: He is alert. Mental status is at baseline.     ED Results / Procedures / Treatments  Labs (all labs ordered are listed, but only abnormal results are displayed) Labs Reviewed  RESP PANEL BY RT-PCR (FLU A&B, COVID) ARPGX2 - Abnormal; Notable for the following components:      Result Value   SARS Coronavirus 2 by RT PCR POSITIVE (*)    All other components within normal limits  BASIC METABOLIC PANEL - Abnormal; Notable for the following components:   BUN <5 (*)    All other components within normal limits  CBC - Abnormal; Notable for the following components:   WBC 2.6 (*)    All other components within normal limits  TROPONIN I (HIGH SENSITIVITY)    EKG None  Radiology DG Chest Portable 1 View  Result Date: 10/19/2020 CLINICAL DATA:  COVID exposure Chest pain EXAM: PORTABLE CHEST 1 VIEW COMPARISON:  None. FINDINGS: The heart size and mediastinal contours  are within normal limits. Both lungs are clear. The visualized skeletal structures are unremarkable. Left lateral chest incompletely visualized. IMPRESSION: No acute process Electronically Signed   By: Acquanetta Belling M.D.   On: 10/19/2020 16:40    Procedures Procedures (including critical care time)  Medications Ordered in ED Medications - No data to display  ED Course  I have reviewed the triage vital signs and the nursing notes.  Pertinent labs & imaging results that were available during my care of the patient were reviewed by me and considered in my medical decision making (see chart for details).    MDM Rules/Calculators/A&P                          Patient labs unremarkable.  Covid test positive.  Patient advised isolation rest at home fluid intake.  Advised finger pulse oximeter use and to return if the number drops below 90% or if he has any additional concerns.   Final Clinical Impression(s) / ED Diagnoses Final diagnoses:  COVID-19 virus infection    Rx / DC Orders ED Discharge Orders    None       Cheryll Cockayne, MD 10/19/20 [name]

## 2020-10-19 NOTE — Discharge Instructions (Addendum)
Call your primary care doctor in the next 1-2 days to arrange video follow-up.  Use a finger pulse oximeter at home.  You may purchase one at [facility] or [facility] or online.  If the numbers drops and stays below 90%, return immediately back to the ER.  Otherwise increase your fluid intake, isolate at home for 5 days after symptoms resolve, and inform recent close contacts of the need to test for Covid.  

## 2020-10-19 NOTE — ED Notes (Signed)
Dr Lockie Mola notified of pos covid result.

## 2020-10-19 NOTE — ED Triage Notes (Signed)
Pt here with shob and chest pain. Reports his family is sick with covid.

## 2021-03-07 ENCOUNTER — Other Ambulatory Visit: Payer: Self-pay

## 2021-03-07 ENCOUNTER — Ambulatory Visit (HOSPITAL_COMMUNITY)
Admission: EM | Admit: 2021-03-07 | Discharge: 2021-03-07 | Disposition: A | Discharge status: Home / Self Care | Payer: Self-pay | Attending: Emergency Medicine | Admitting: Emergency Medicine

## 2021-03-07 ENCOUNTER — Encounter (HOSPITAL_COMMUNITY): Payer: Self-pay

## 2021-03-07 DIAGNOSIS — R369 Urethral discharge, unspecified: Secondary | ICD-10-CM | POA: Insufficient documentation

## 2021-03-07 NOTE — Discharge Instructions (Signed)
Labs pending 2-3 days, will be called if positive  Do not have sex until labs result, if positive do not have sex until treatment is complete

## 2021-03-07 NOTE — ED Triage Notes (Signed)
Pt c/o penile discharge X this morning. Pt states he is not currently sexually active.

## 2021-03-08 LAB — CYTOLOGY, (ORAL, ANAL, URETHRAL) ANCILLARY ONLY
Chlamydia: NEGATIVE
Comment: NEGATIVE
Comment: NEGATIVE
Comment: NORMAL
Neisseria Gonorrhea: NEGATIVE
Trichomonas: NEGATIVE

## 2021-03-08 NOTE — ED Provider Notes (Signed)
[facility]    CSN: [id number] Arrival date & time: 03/07/21  1542      History   Chief Complaint No chief complaint on file.   HPI Seth Nolan is a 35 y.o. male.   Patient presents with yellow penile discharge starting this morning. Denies itching, odor, pee now or testicle swelling, urinary frequency, urgency, Abdominal pain, flank pain, rash, lesion. Attest to oral sex but denies vaginal or anal sex. Difficulty obtaining clear history from patient.   Past Medical History:  Diagnosis Date  . Acute alcoholic gastritis without hemorrhage 08/2016  . ETOH abuse   . Marijuana abuse     Patient Active Problem List   Diagnosis Date Noted  . Substance induced mood disorder (HCC) 07/07/2017  . 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 No pertinent surgical history.     Home Medications    Prior to Admission medications   Medication Sig Start Date End Date Taking? Authorizing Provider  amoxicillin-clavulanate (AUGMENTIN) 875-125 MG tablet Take 1 tablet by mouth every 12 (twelve) hours. 06/21/19   Wallis Bamberg, PA-C  naproxen (NAPROSYN) 500 MG tablet Take 1 tablet (500 mg total) by mouth 2 (two) times daily. 06/21/19   Wallis Bamberg, PA-C  traZODone (DESYREL) 50 MG tablet Take 1 tablet (50 mg total) by mouth at bedtime as needed for sleep. 07/10/17 06/21/19  Sanjuana Kava, NP    Family History Family History  Family history unknown: Yes    Social History Social History   Tobacco Use  . Smoking status: Current Every Day Smoker    Packs/day: 0.25    Types: Cigarettes  . Smokeless tobacco: Never Used  Substance Use Topics  . Alcohol use: Yes    Alcohol/week: 2.0 standard drinks    Types: 2 Cans of beer per week  . Drug use: Yes    Frequency: 7.0 times per week    Types: Marijuana    Comment: pt states whenever he is bored/daily     Allergies   Patient has no known  allergies.   Review of Systems Review of Systems  Constitutional: Negative.   Respiratory: Negative.   Cardiovascular: Negative.   Genitourinary: Positive for penile discharge. Negative for decreased urine volume, difficulty urinating, dysuria, enuresis, flank pain, frequency, genital sores, hematuria, penile pain, penile swelling, scrotal swelling, testicular pain and urgency.  Skin: Negative.   Neurological: Negative.      Physical Exam Triage Vital Signs ED Triage Vitals  Enc Vitals Group     BP 03/07/21 1620 (!) 134/95     Pulse Rate 03/07/21 1620 77     Resp 03/07/21 1620 20     Temp 03/07/21 1620 99.1 F (37.3 C)     Temp Source 03/07/21 1620 Oral     SpO2 03/07/21 1620 99 %     Weight --      Height --      Head Circumference --      Peak Flow --      Pain Score 03/07/21 1619 0     Pain Loc --      Pain Edu? --      Excl. in GC? --    No data found.  Updated Vital Signs BP (!) 134/95 (BP Location: Right Arm)   Pulse 77   Temp 99.1 F (37.3 C) (Oral)   Resp 20   SpO2 99%   Visual Acuity Right Eye  Distance:   Left Eye Distance:   Bilateral Distance:    Right Eye Near:   Left Eye Near:    Bilateral Near:     Physical Exam Constitutional:      Appearance: Normal appearance. He is normal weight.  HENT:     Head: Normocephalic.  Eyes:     Extraocular Movements: Extraocular movements intact.  Pulmonary:     Effort: Pulmonary effort is normal.  Genitourinary:    Comments: Deferred, self collect penile swab Musculoskeletal:        General: Normal range of motion.     Cervical back: Normal range of motion.  Skin:    General: Skin is warm and dry.  Neurological:     General: No focal deficit present.     Mental Status: He is alert and oriented to person, place, and time. Mental status is at baseline.  Psychiatric:        Mood and Affect: Mood normal.        Behavior: Behavior normal.        Thought Content: Thought content normal.         Judgment: Judgment normal.      UC Treatments / Results  Labs (all labs ordered are listed, but only abnormal results are displayed) Labs Reviewed  CYTOLOGY, (ORAL, ANAL, URETHRAL) ANCILLARY ONLY    EKG   Radiology No results found.  Procedures Procedures (including critical care time)  Medications Ordered in UC Medications - No data to display  Initial Impression / Assessment and Plan / UC Course  I have reviewed the triage vital signs and the nursing notes.  Pertinent labs & imaging results that were available during my care of the patient were reviewed by me and considered in my medical decision making (see chart for details).  Penile discharge  1. sti screening, pending 2-3 days, will treat per protocol 2. Advised abstinence until labs result and/or treatment is complete  Final Clinical Impressions(s) / UC Diagnoses   Final diagnoses:  Penile discharge     Discharge Instructions     Labs pending 2-3 days, will be called if positive  Do not have sex until labs result, if positive do not have sex until treatment is complete     ED Prescriptions    None     PDMP not reviewed this encounter.   Valinda Hoar, [place] 03/08/21 [phone]

## 2022-07-11 ENCOUNTER — Emergency Department (HOSPITAL_BASED_OUTPATIENT_CLINIC_OR_DEPARTMENT_OTHER): Payer: No Typology Code available for payment source

## 2022-07-11 ENCOUNTER — Emergency Department (HOSPITAL_BASED_OUTPATIENT_CLINIC_OR_DEPARTMENT_OTHER)
Admission: EM | Admit: 2022-07-11 | Discharge: 2022-07-11 | Disposition: A | Discharge status: Home / Self Care | Payer: No Typology Code available for payment source | Attending: Emergency Medicine | Admitting: Emergency Medicine

## 2022-07-11 ENCOUNTER — Encounter (HOSPITAL_BASED_OUTPATIENT_CLINIC_OR_DEPARTMENT_OTHER): Payer: Self-pay

## 2022-07-11 ENCOUNTER — Other Ambulatory Visit: Payer: Self-pay

## 2022-07-11 DIAGNOSIS — S61215A Laceration without foreign body of left ring finger without damage to nail, initial encounter: Secondary | ICD-10-CM | POA: Insufficient documentation

## 2022-07-11 DIAGNOSIS — S62635B Displaced fracture of distal phalanx of left ring finger, initial encounter for open fracture: Secondary | ICD-10-CM | POA: Insufficient documentation

## 2022-07-11 DIAGNOSIS — M7989 Other specified soft tissue disorders: Secondary | ICD-10-CM | POA: Insufficient documentation

## 2022-07-11 DIAGNOSIS — Z23 Encounter for immunization: Secondary | ICD-10-CM | POA: Diagnosis not present

## 2022-07-11 DIAGNOSIS — W230XXA Caught, crushed, jammed, or pinched between moving objects, initial encounter: Secondary | ICD-10-CM | POA: Diagnosis not present

## 2022-07-11 DIAGNOSIS — S6992XA Unspecified injury of left wrist, hand and finger(s), initial encounter: Secondary | ICD-10-CM | POA: Diagnosis present

## 2022-07-11 DIAGNOSIS — S62639B Displaced fracture of distal phalanx of unspecified finger, initial encounter for open fracture: Secondary | ICD-10-CM

## 2022-07-11 MED ORDER — CEPHALEXIN 250 MG PO CAPS
500.0000 mg | ORAL_CAPSULE | Freq: Once | ORAL | Status: AC
  Administered 2022-07-11: 500 mg via ORAL
  Filled 2022-07-11: qty 2

## 2022-07-11 MED ORDER — TETANUS-DIPHTH-ACELL PERTUSSIS 5-2.5-18.5 LF-MCG/0.5 IM SUSY
0.5000 mL | PREFILLED_SYRINGE | Freq: Once | INTRAMUSCULAR | Status: AC
  Administered 2022-07-11: 0.5 mL via INTRAMUSCULAR
  Filled 2022-07-11: qty 0.5

## 2022-07-11 MED ORDER — CEPHALEXIN 500 MG PO CAPS
500.0000 mg | ORAL_CAPSULE | Freq: Four times a day (QID) | ORAL | 0 refills | Status: DC
Start: 2022-07-11 — End: 2024-05-22

## 2022-07-11 MED ORDER — LIDOCAINE HCL (PF) 1 % IJ SOLN
10.0000 mL | Freq: Once | INTRAMUSCULAR | Status: AC
  Administered 2022-07-11: 10 mL
  Filled 2022-07-11: qty 10

## 2022-07-11 NOTE — ED Triage Notes (Signed)
Pt reports he slammed left ring finger in drawer approx 2 hourts ago

## 2022-07-11 NOTE — Discharge Instructions (Signed)
Please read and follow all provided instructions.  Your diagnoses today include:  1. Laceration of left ring finger without foreign body without damage to nail, initial encounter   2. Open fracture of tuft of distal phalanx of finger     Tests performed today include: X-ray of the affected area that shows a broken bone on the very tip of the finger Vital signs. See below for your results today.   Medications prescribed:  Keflex (cephalexin) - antibiotic  You have been prescribed an antibiotic medicine: take the entire course of medicine even if you are feeling better. Stopping early can cause the antibiotic not to work.  Take any prescribed medications only as directed.   Home care instructions:  Follow any educational materials and wound care instructions contained in this packet.   Keep affected area above the level of your heart when possible to minimize swelling. Wash area gently twice a day with warm soapy water. Do not apply alcohol or hydrogen peroxide. Cover the area if it draining or weeping.   Follow-up instructions: Suture Removal: Return to the Emergency Department or see your provider in 10 days for a recheck of your wound and removal of your sutures or staples.    Return instructions:  Return to the Emergency Department if you have: Fever Worsening pain Worsening swelling of the wound Pus draining from the wound Redness of the skin that moves away from the wound, especially if it streaks away from the affected area  Any other emergent concerns  Your vital signs today were: BP (!) 127/90   Pulse 99   Temp 97.6 F (36.4 C)   Resp 16   Ht 6\' 1"  (1.854 m)   Wt 72.6 kg   SpO2 97%   BMI 21.11 kg/m  If your blood pressure (BP) was elevated above 135/85 this visit, please have this repeated by your doctor within one month. --------------

## 2022-07-11 NOTE — ED Provider Notes (Signed)
[facility] Provider Note   CSN: [id number] Arrival date & time: 07/11/22  1006     History  Chief Complaint  Patient presents with   Finger Injury    Seth Nolan is a 36 y.o. male.  Patient who is right-hand dominant presents to the emergency department today for evaluation of left ring finger injury occurring approximately 30 minutes prior to arrival.  Patient states that he got his left ring finger pinched between the door of a dresser and the dresser itself.  He had bleeding which was controlled on emergency department arrival.  Pain in the tip of the finger with associated laceration.  He is having difficulty flexing the tip of the finger.  There is swelling of the fingertip.  Denies other injuries.  Unknown last tetanus.      Home Medications Prior to Admission medications   Medication Sig Start Date End Date Taking? Authorizing Provider  amoxicillin-clavulanate (AUGMENTIN) 875-125 MG tablet Take 1 tablet by mouth every 12 (twelve) hours. 06/21/19   Jaynee Eagles, PA-C  naproxen (NAPROSYN) 500 MG tablet Take 1 tablet (500 mg total) by mouth 2 (two) times daily. 06/21/19   Jaynee Eagles, PA-C  traZODone (DESYREL) 50 MG tablet Take 1 tablet (50 mg total) by mouth at bedtime as needed for sleep. 07/10/17 06/21/19  Encarnacion Slates, NP      Allergies    Patient has no known allergies.    Review of Systems   Review of Systems  Physical Exam Updated Vital Signs BP (!) 127/90   Pulse 99   Temp 97.6 F (36.4 C)   Resp 16   Ht 6\' 1"  (1.854 m)   Wt 72.6 kg   SpO2 97%   BMI 21.11 kg/m   Physical Exam Vitals and nursing note reviewed.  Constitutional:      Appearance: He is well-developed.  HENT:     Head: Normocephalic and atraumatic.  Eyes:     Conjunctiva/sclera: Conjunctivae normal.  Pulmonary:     Effort: No respiratory distress.  Musculoskeletal:     Cervical back: Normal range of motion and neck supple.  Skin:    General: Skin is  warm and dry.     Comments: Left ring finger: There is a 1 cm laceration over the distal end of the finger, volarly with some exposed subcutaneous fatty tissue, deep on exploration extending towards the tuft of the distal phalanx.  No obvious tendon injury visualized.  Wound base clean, hemostatic.  There is also approximately 8 mm laceration on the dorsal aspect of the finger tip abutting the periphery of the nailbed and plate, extending slightly into the nailbed.  Wound base clean, hemostatic.  Neurological:     Mental Status: He is alert.     Comments: Left ring finger: He is able to flex and extend at the DIP joint when this joint is isolated.     ED Results / Procedures / Treatments   Labs (all labs ordered are listed, but only abnormal results are displayed) Labs Reviewed - No data to display  EKG None  Radiology No results found.  Procedures .Marland KitchenLaceration Repair  Date/Time: 07/11/2022 11:46 AM  Performed by: Carlisle Cater, PA-C Authorized by: Carlisle Cater, PA-C   Consent:    Consent obtained:  Verbal   Consent given by:  Patient   Risks discussed:  Infection, pain, retained foreign body, need for additional repair, nerve damage, tendon damage and vascular damage Universal protocol:  Patient identity confirmed:  Verbally with patient Anesthesia:    Anesthesia method:  Nerve block   Block needle gauge:  27 G   Block anesthetic:  Lidocaine 1% w/o epi   Block technique:  Transthecal block R ring   Block injection procedure:  Anatomic landmarks identified, introduced needle and incremental injection   Block outcome:  Anesthesia achieved Laceration details:    Location:  Finger   Finger location:  L ring finger   Length (cm):  1 Pre-procedure details:    Preparation:  Patient was prepped and draped in usual sterile fashion and imaging obtained to evaluate for foreign bodies Exploration:    Imaging obtained: x-ray     Imaging outcome: foreign body not noted      Wound exploration: wound explored through full range of motion and entire depth of wound visualized     Wound extent: underlying fracture     Wound extent: no foreign bodies/material noted and no tendon damage noted     Contaminated: no   Treatment:    Area cleansed with:  Saline   Amount of cleaning:  Extensive   Irrigation solution:  Sterile water and sterile saline   Irrigation volume:  1000cc   Irrigation method:  Pressure wash   Debridement:  None Skin repair:    Repair method:  Sutures   Suture size:  5-0   Suture material:  Nylon   Suture technique:  Simple interrupted   Number of sutures:  8 Approximation:    Approximation:  Close Repair type:    Repair type:  Simple Post-procedure details:    Dressing:  Open (no dressing)   Procedure completion:  Tolerated well, no immediate complications .Marland KitchenLaceration Repair  Date/Time: 07/11/2022 11:50 AM  Performed by: Renne Crigler, PA-C Authorized by: Renne Crigler, PA-C   Consent:    Consent obtained:  Verbal   Consent given by:  Patient   Risks discussed:  Infection, pain, retained foreign body, tendon damage, vascular damage, poor wound healing, need for additional repair and nerve damage Universal protocol:    Patient identity confirmed:  Verbally with patient Anesthesia:    Anesthesia method:  Nerve block (see block detailed on other procedure note) Laceration details:    Location:  Finger   Finger location:  L ring finger   Length (cm):  0.8 Pre-procedure details:    Preparation:  Patient was prepped and draped in usual sterile fashion and imaging obtained to evaluate for foreign bodies Exploration:    Imaging obtained: x-ray     Imaging outcome: foreign body not noted     Wound exploration: wound explored through full range of motion and entire depth of wound visualized     Wound extent: no foreign bodies/material noted and no vascular damage noted     Wound extent comment:  Involves nail bed   Contaminated: no    Treatment:    Area cleansed with:  Saline   Amount of cleaning:  Extensive   Irrigation volume:  1000cc   Irrigation method:  Pressure wash Skin repair:    Repair method:  Sutures   Suture size:  5-0   Suture material:  Nylon   Suture technique:  Simple interrupted   Number of sutures:  3 Approximation:    Approximation:  Close Repair type:    Repair type:  Intermediate Post-procedure details:    Dressing:  Open (no dressing)   Procedure completion:  Tolerated well, no immediate complications Comments:     2  of the sutures were partially sewn through the nail plate.  I did resect a small portion of the lateral nail plate to better visualize the portion of the wound that involves the nailbed.  I considered removing the nail entirely, however the wound extended onto the only the very distalmost portion of the nailbed.  I feel that removal of the entire nail would have cause more trauma and was not indicated at this time.  Nailbed wound was reasonably closed using this technique.     Medications Ordered in ED Medications  lidocaine (PF) (XYLOCAINE) 1 % injection 10 mL (has no administration in time range)  Tdap (BOOSTRIX) injection 0.5 mL (has no administration in time range)    ED Course/ Medical Decision Making/ A&P    Patient seen and examined. History obtained directly from patient.   Labs/EKG: None ordered  Imaging: Finger x-ray  Medications/Fluids: T-dap, keflex, lidocaine 1%  Most recent vital signs reviewed and are as follows: BP (!) 127/90   Pulse 99   Temp 97.6 F (36.4 C)   Resp 16   Ht 6\' 1"  (1.854 m)   Wt 72.6 kg   SpO2 97%   BMI 21.11 kg/m   Initial impression: Tuft fracture, 2 lacerations to the distal portion of the finger due to crush injury.  Discussed procedure of nerve block, cleaning, exploration and wound closure with patient.  Discussed risk and benefits.  He agrees to proceed.  Procedure performed without complications.  Finger tourniquet  was in place for approximately 20 minutes.  11:55 AM Reassessment performed. Patient appears comfortable. Exam unchanged. Finger splint placed after closure which proceeded well without difficulties or complications.  Imaging personally visualized and interpreted including: X-ray of the finger, agree tuft fracture, no foreign body.  Reviewed pertinent lab work and imaging with patient at bedside. Questions answered.   Most current vital signs reviewed and are as follows: BP (!) 127/90   Pulse 99   Temp 97.6 F (36.4 C)   Resp 16   Ht 6\' 1"  (1.854 m)   Wt 72.6 kg   SpO2 97%   BMI 21.11 kg/m   Plan: Discharge to home.   Prescriptions written for: Keflex prophylaxis x5 days  Other home care instructions discussed: Patient counseled on wound care.    ED return instructions discussed: Patient was urged to return to the Emergency Department urgently with worsening pain, swelling, expanding erythema especially if it streaks away from the affected area, fever, or if they have any other concerns.   Follow-up instructions discussed: Patient counseled on need to return or see PCP/urgent care for suture removal in 10 days.  Strongly encouraged patient to call orthopedic hand referral for recheck in 5 days.                             Medical Decision Making Amount and/or Complexity of Data Reviewed Radiology: ordered.  Risk Prescription drug management.   Patient with distal finger crush injury, tuft fracture, open laceration and another laceration which extended onto the nailbed as described above.  Patient does seem to have the ability to flex the digit and I have overall low concern for significant tendon injury.  I did not see any lidocaine flow through the wound during transthecal block.  I do not visualize any tendon injury or foreign body.  Patient does have a open tuft fracture.  He was treated for this with Keflex.  Wound was  copiously irrigated and closed.  Minor distal  nailbed injury as well.  Would benefit from outpatient hand follow-up to ensure appropriate healing and function.  However, no indication for emergent orthopedic hand involvement today.  Patient tolerated procedure well.        Final Clinical Impression(s) / ED Diagnoses Final diagnoses:  Laceration of left ring finger without foreign body without damage to nail, initial encounter  Open fracture of tuft of distal phalanx of finger    Rx / DC Orders ED Discharge Orders          Ordered    cephALEXin (KEFLEX) 500 MG capsule  4 times daily        07/11/22 1145              Renne Crigler, PA-C 07/11/22 1157    Glyn Ade, MD 07/11/22 1247

## 2022-07-11 NOTE — ED Notes (Signed)
Patient transported to X-ray 

## 2022-07-11 NOTE — ED Notes (Signed)
Reviewed discharge instructions, laceration care and new medications . No voiced concerns. States understanding

## 2022-07-21 ENCOUNTER — Other Ambulatory Visit: Payer: Self-pay

## 2022-07-21 ENCOUNTER — Encounter (HOSPITAL_BASED_OUTPATIENT_CLINIC_OR_DEPARTMENT_OTHER): Payer: Self-pay | Admitting: Emergency Medicine

## 2022-07-21 ENCOUNTER — Emergency Department (HOSPITAL_BASED_OUTPATIENT_CLINIC_OR_DEPARTMENT_OTHER)
Admission: EM | Admit: 2022-07-21 | Discharge: 2022-07-21 | Disposition: A | Discharge status: Home / Self Care | Payer: No Typology Code available for payment source | Attending: Emergency Medicine | Admitting: Emergency Medicine

## 2022-07-21 DIAGNOSIS — Z4802 Encounter for removal of sutures: Secondary | ICD-10-CM | POA: Diagnosis present

## 2022-07-21 NOTE — ED Provider Notes (Signed)
[facility] Provider Note   CSN: [id number] Arrival date & time: 07/21/22  6314     History  Chief Complaint  Patient presents with   Suture / Staple Removal    Seth Nolan is a 36 y.o. male here for suture removal  He had 8 sutures and 3 sutures placed in a finger laceration on 07/11/2022.  He denies any numbness or tingling.  Denies any signs or symptoms of infection   Suture / Staple Removal       Home Medications Prior to Admission medications   Medication Sig Start Date End Date Taking? Authorizing Provider  cephALEXin (KEFLEX) 500 MG capsule Take 1 capsule (500 mg total) by mouth 4 (four) times daily. 07/11/22   Carlisle Cater, PA-C  naproxen (NAPROSYN) 500 MG tablet Take 1 tablet (500 mg total) by mouth 2 (two) times daily. 06/21/19   Jaynee Eagles, PA-C  traZODone (DESYREL) 50 MG tablet Take 1 tablet (50 mg total) by mouth at bedtime as needed for sleep. 07/10/17 06/21/19  Encarnacion Slates, NP      Allergies    Patient has no known allergies.    Review of Systems   Review of Systems  Physical Exam Updated Vital Signs BP (!) 146/101 (BP Location: Right Arm)   Pulse 73   Temp 98.2 F (36.8 C) (Oral)   Resp 16   Ht 6\' 1"  (1.854 m)   Wt 74.8 kg   SpO2 100%   BMI 21.77 kg/m  Physical Exam Vitals and nursing note reviewed.  Constitutional:      General: He is not in acute distress.    Appearance: He is well-developed. He is not diaphoretic.  HENT:     Head: Normocephalic and atraumatic.  Eyes:     General: No scleral icterus.    Conjunctiva/sclera: Conjunctivae normal.  Cardiovascular:     Rate and Rhythm: Normal rate and regular rhythm.     Heart sounds: Normal heart sounds.  Pulmonary:     Effort: Pulmonary effort is normal. No respiratory distress.     Breath sounds: Normal breath sounds.  Abdominal:     Palpations: Abdomen is soft.     Tenderness: There is no abdominal tenderness.  Musculoskeletal:     Cervical back:  Normal range of motion and neck supple.     Comments: Left ring finger appears well-healing with multiple sutures in place.  Skin:    General: Skin is warm and dry.  Neurological:     Mental Status: He is alert.  Psychiatric:        Behavior: Behavior normal.     ED Results / Procedures / Treatments   Labs (all labs ordered are listed, but only abnormal results are displayed) Labs Reviewed - No data to display  EKG None  Radiology No results found.  Procedures .Suture Removal  Date/Time: 07/21/2022 10:04 AM  Performed by: Margarita Mail, PA-C Authorized by: Margarita Mail, PA-C   Consent:    Consent obtained:  Verbal   Risks discussed:  Bleeding, pain and wound separation   Alternatives discussed:  No treatment Universal protocol:    Patient identity confirmed:  Verbally with patient Location:    Location:  Upper extremity   Upper extremity location:  Hand   Hand location:  L ring finger Procedure details:    Wound appearance:  No signs of infection, good wound healing and clean   Number of sutures removed:  8 Post-procedure details:  Post-removal:  Dressing applied   Procedure completion:  Tolerated well, no immediate complications .Suture Removal  Date/Time: 07/21/2022 10:05 AM  Performed by: Arthor Captain, PA-C Authorized by: Arthor Captain, PA-C   Consent:    Consent obtained:  Verbal   Consent given by:  Patient   Risks discussed:  Bleeding, pain and wound separation   Alternatives discussed:  No treatment Universal protocol:    Patient identity confirmed:  Verbally with patient Location:    Location:  Upper extremity   Upper extremity location:  Hand   Hand location:  L ring finger Procedure details:    Wound appearance:  No signs of infection, good wound healing and clean   Number of sutures removed:  3 Post-procedure details:    Post-removal:  Dressing applied   Procedure completion:  Tolerated well, no immediate complications      Medications Ordered in ED Medications - No data to display  ED Course/ Medical Decision Making/ A&P                           Medical Decision Making Suture removal   Pt to ER for staple/suture removal and wound check as above. Procedure tolerated well. Vitals normal, no signs of infection.Wound care  & return precautions given at dc.             Final Clinical Impression(s) / ED Diagnoses Final diagnoses:  Visit for suture removal    Rx / DC Orders ED Discharge Orders     None         Arthor Captain, PA-C 07/21/22 1007    Terrilee Files, MD 07/21/22 1744

## 2022-07-21 NOTE — ED Triage Notes (Signed)
Suture removal

## 2022-07-21 NOTE — Discharge Instructions (Signed)
Contact a health care provider if: You have more redness, swelling, or pain around your wound. You have fluid or blood coming from your wound. You have new warmth, a rash, or hardness at the wound site. You have pus or a bad smell coming from your wound. Your wound opens up. Get help right away if: You have a fever or chills. You have red streaks coming from your wound. 

## 2022-08-24 IMAGING — CR DG ANKLE COMPLETE 3+V*R*
3 series · 3 of 3 positions shown · non-contrast
Comparison: None.

CLINICAL DATA: 34-year-old male with a history occupational injury

EXAM:
RIGHT ANKLE - COMPLETE 3+ VIEW

[ankle ap]
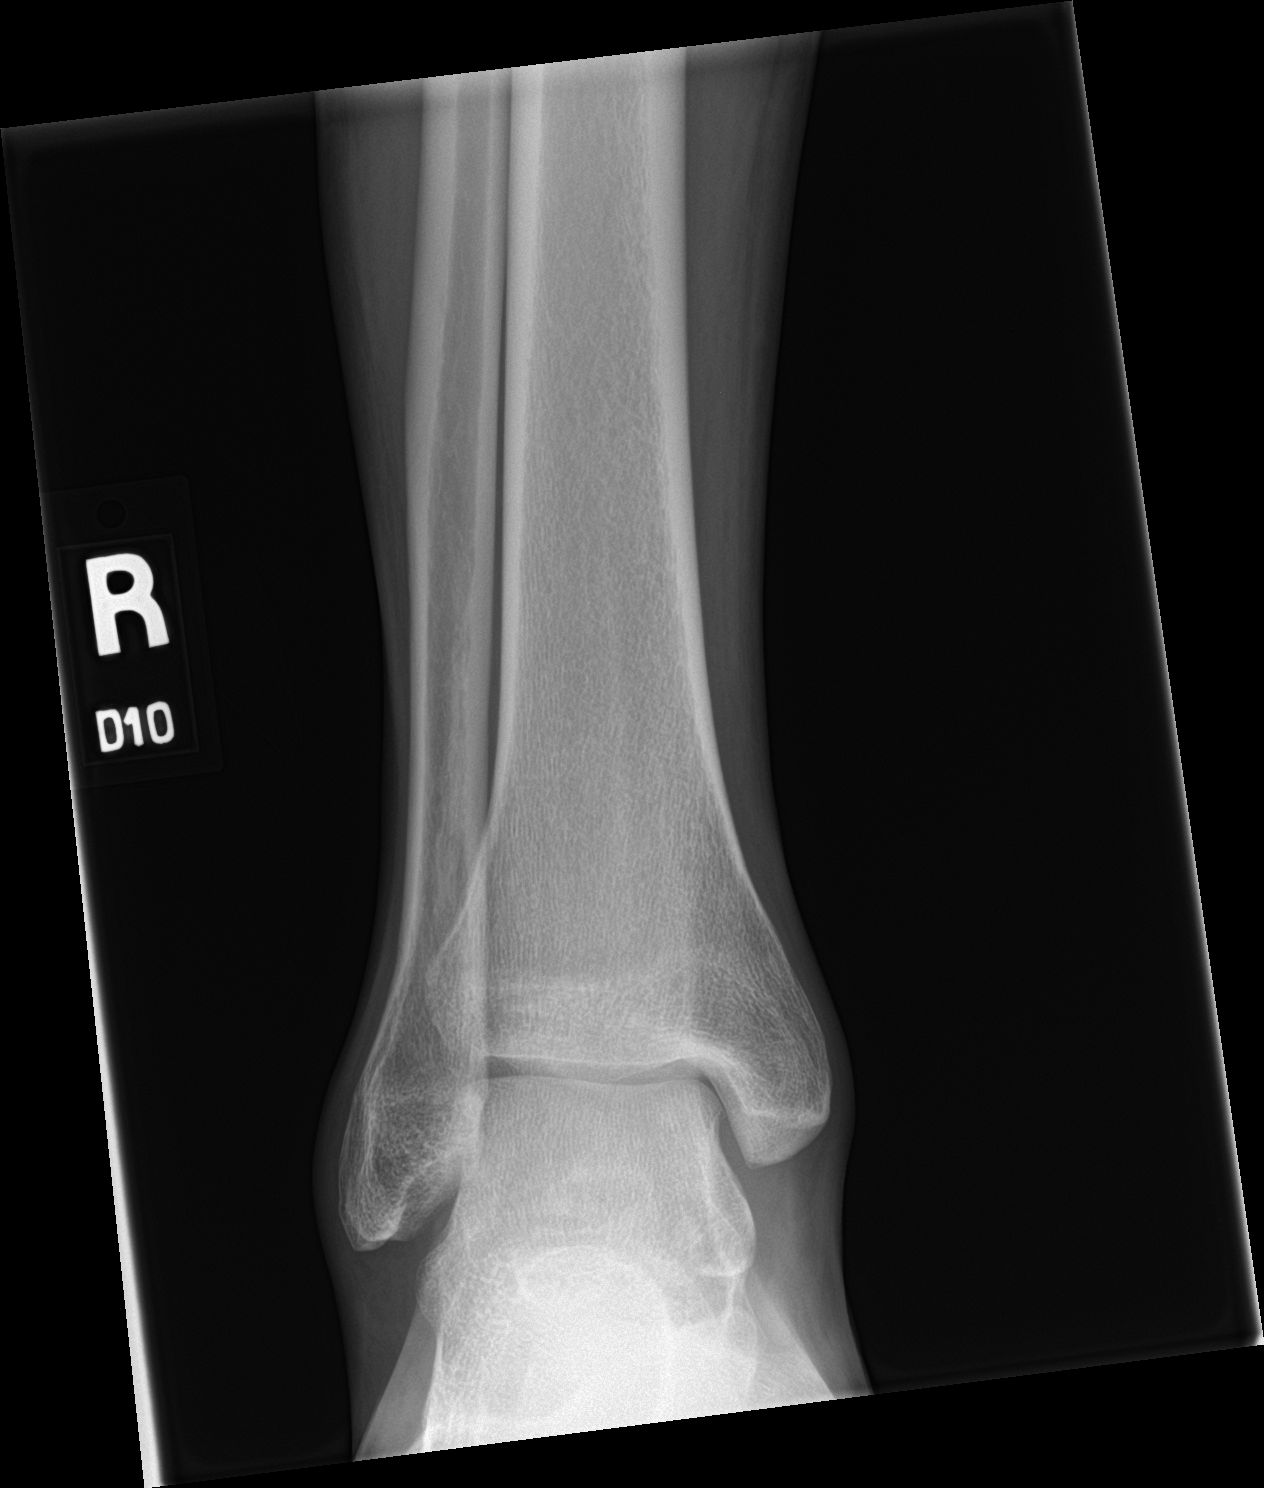

[ankle obl]
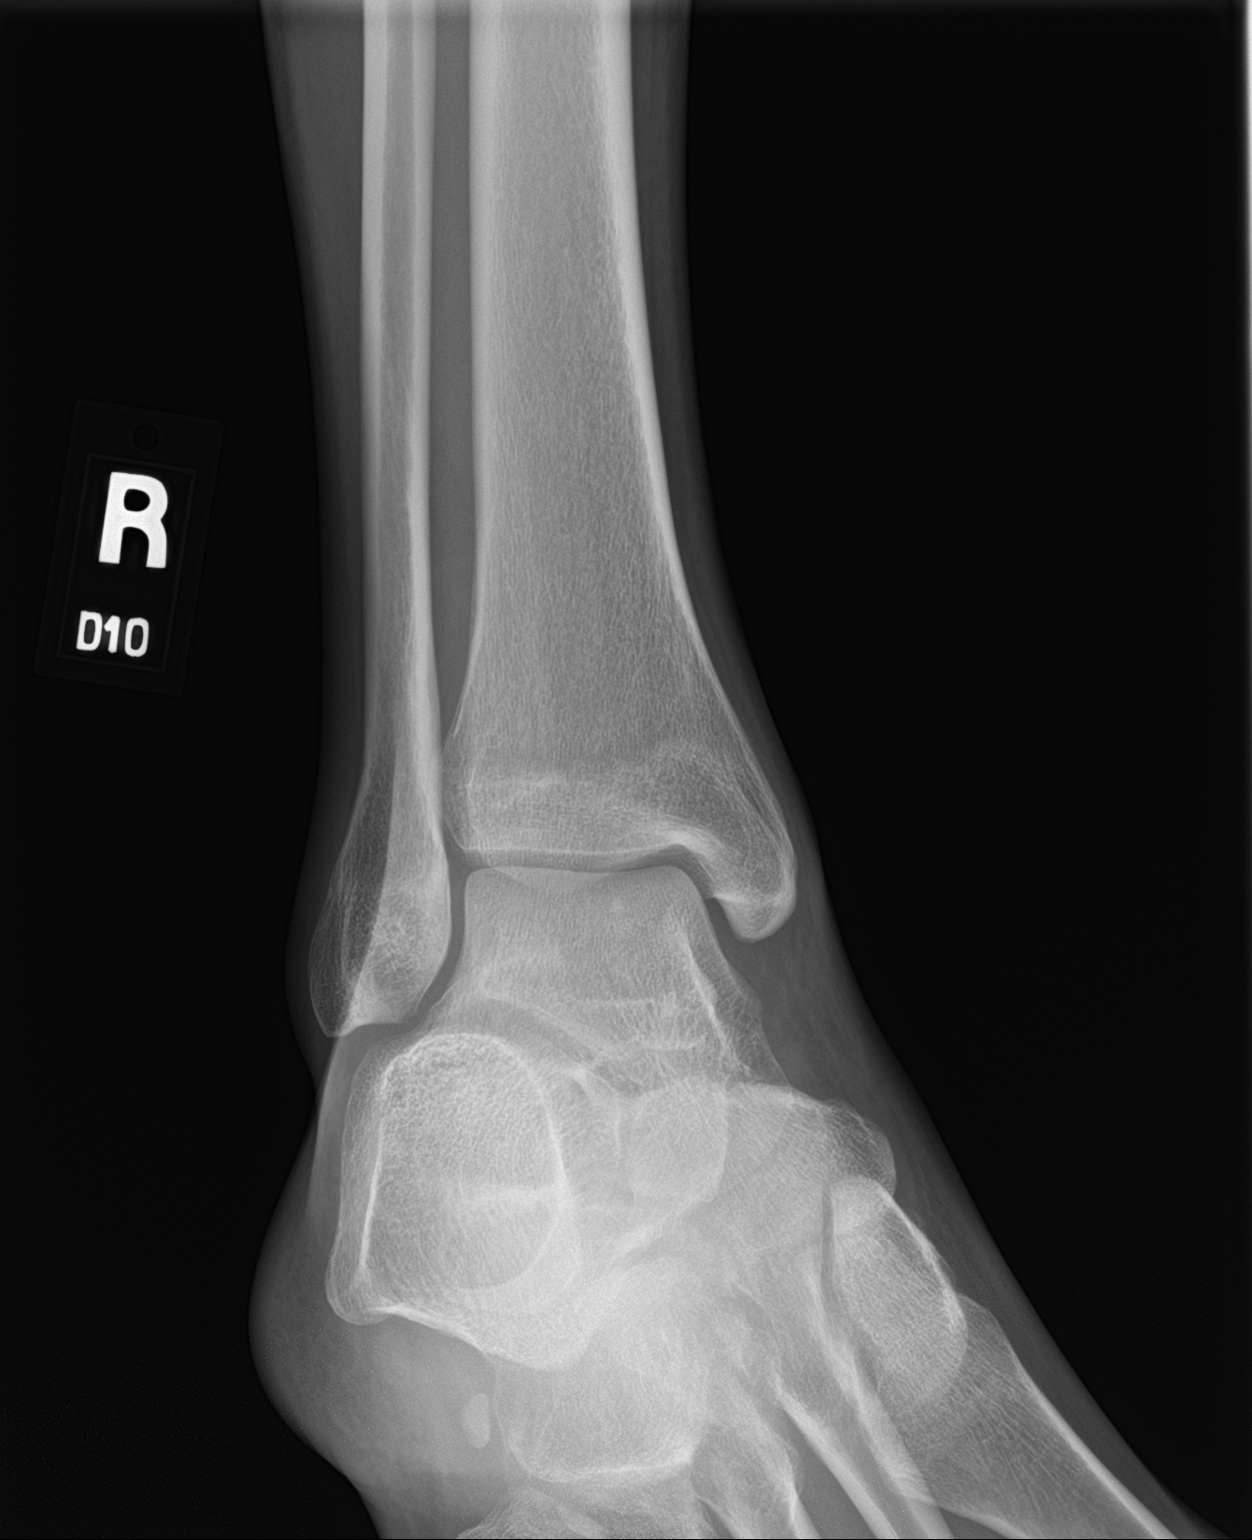

[ankle lat]
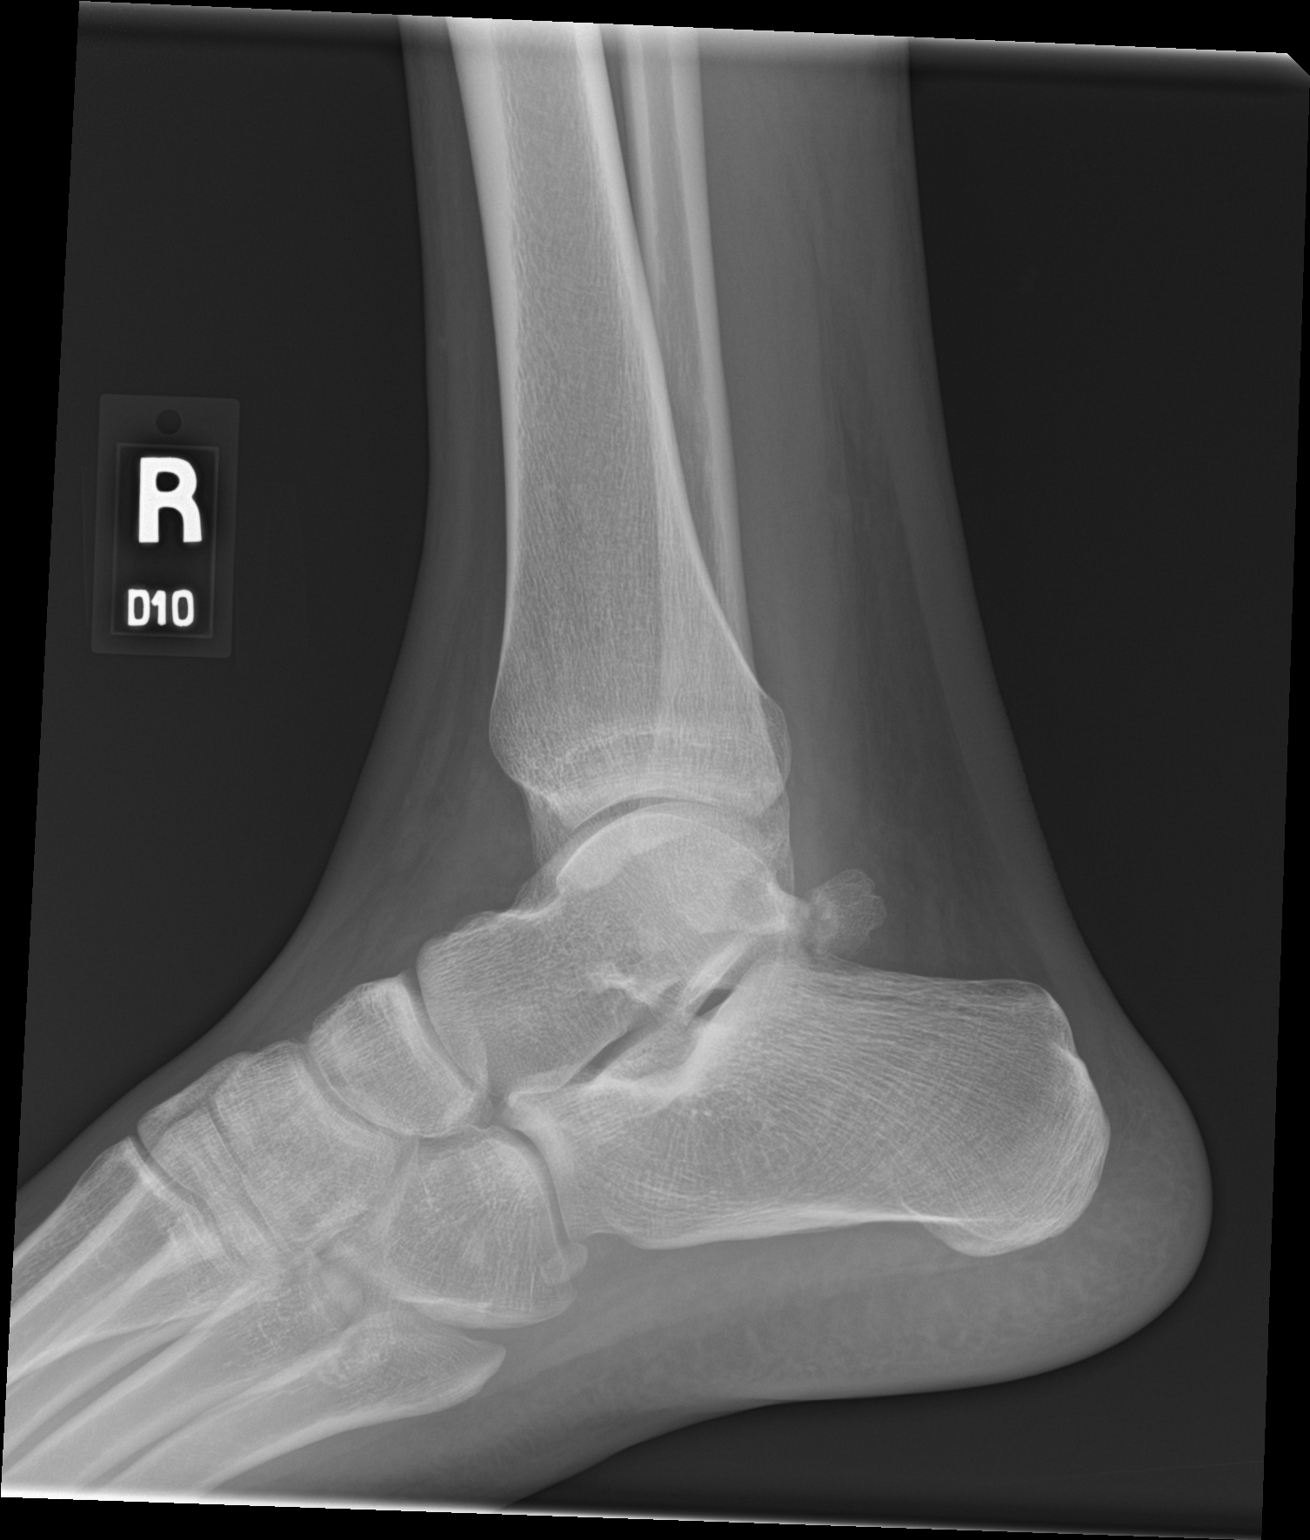

[3 of 3 positions shown; findings below may reference images not displayed]

FINDINGS: There is no evidence of fracture, dislocation, or joint effusion.
There is no evidence of arthropathy or other focal bone abnormality.
Soft tissues are unremarkable.
IMPRESSION: Negative.

## 2022-10-28 IMAGING — DX DG CHEST 1V PORT
1 series · 1 of 1 positions shown · non-contrast
Comparison: None.

CLINICAL DATA: COVID exposure

Chest pain
EXAM:
PORTABLE CHEST 1 VIEW

[chest]
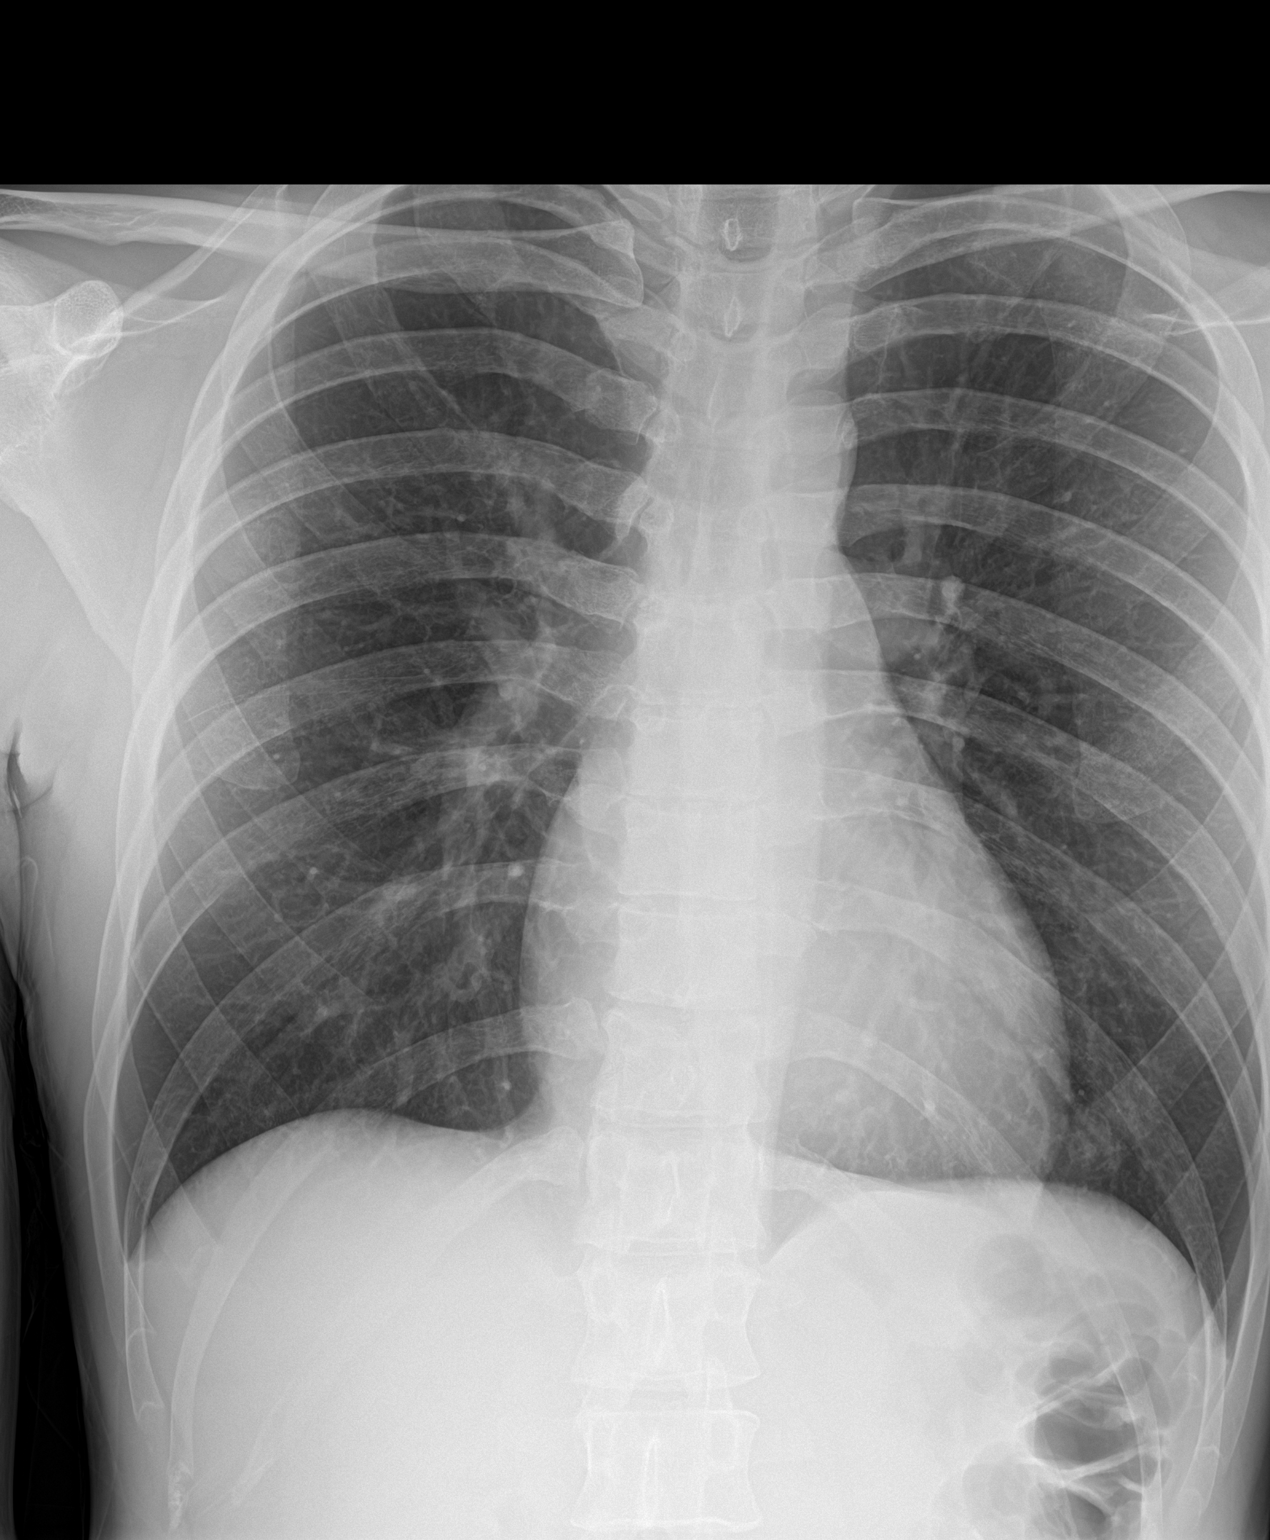

[1 of 1 positions shown; findings below may reference images not displayed]

FINDINGS: The heart size and mediastinal contours are within normal limits.
Both lungs are clear. The visualized skeletal structures are
unremarkable. Left lateral chest incompletely visualized.
IMPRESSION: No acute process

## 2023-01-31 ENCOUNTER — Encounter (HOSPITAL_COMMUNITY): Payer: Self-pay

## 2023-01-31 ENCOUNTER — Emergency Department (HOSPITAL_COMMUNITY)
Admission: EM | Admit: 2023-01-31 | Discharge: 2023-01-31 | Disposition: A | Discharge status: Home / Self Care | Payer: Medicaid Other | Attending: Emergency Medicine | Admitting: Emergency Medicine

## 2023-01-31 ENCOUNTER — Emergency Department (HOSPITAL_COMMUNITY): Payer: Medicaid Other

## 2023-01-31 ENCOUNTER — Other Ambulatory Visit: Payer: Self-pay

## 2023-01-31 DIAGNOSIS — Y9367 Activity, basketball: Secondary | ICD-10-CM | POA: Diagnosis not present

## 2023-01-31 DIAGNOSIS — M25562 Pain in left knee: Secondary | ICD-10-CM | POA: Insufficient documentation

## 2023-01-31 DIAGNOSIS — X501XXA Overexertion from prolonged static or awkward postures, initial encounter: Secondary | ICD-10-CM | POA: Insufficient documentation

## 2023-01-31 MED ORDER — MELOXICAM 15 MG PO TABS: 15.0000 mg | ORAL_TABLET | Freq: Every day | ORAL | 0 refills | Status: AC

## 2023-01-31 MED ORDER — KETOROLAC TROMETHAMINE 15 MG/ML IJ SOLN
15.0000 mg | Freq: Once | INTRAMUSCULAR | Status: AC
  Administered 2023-01-31: 15 mg via INTRAMUSCULAR
  Filled 2023-01-31: qty 1

## 2023-01-31 NOTE — Progress Notes (Signed)
Orthopedic Tech Progress Note Patient Details:  Pierson Vantol [birth year] [id number]  Ortho Devices Type of Ortho Device: Crutches, Knee Immobilizer Ortho Device/Splint Location: LLE Ortho Device/Splint Interventions: Application   Post Interventions Patient Tolerated: Well  Genelle Bal Essense Bousquet 01/31/2023, 11:27 AM

## 2023-01-31 NOTE — ED Provider Notes (Signed)
[place] EMERGENCY DEPARTMENT AT [facility] Provider Note   CSN: [id number] Arrival date & time: 01/31/23  4098     History  No chief complaint on file.   Seth Nolan is a 37 y.o. male.  Patient presents to the emergency department complaining of left-sided knee pain.  Patient states he was playing basketball last night when he landed awkwardly on a fellow player's foot.  He believes he dislocated his left knee during the incident.  He states he was able to straighten his knee back out while there.  This morning he woke up with swelling around the area and reports difficulty bearing weight due to pain.  He denies any other associated injuries.  Past medical history significant for marijuana abuse, alcohol abuse, alcoholic gastritis, major depressive disorder  HPI     Home Medications Prior to Admission medications   Medication Sig Start Date End Date Taking? Authorizing Provider  meloxicam (MOBIC) 15 MG tablet Take 1 tablet (15 mg total) by mouth daily. 01/31/23 03/02/23 Yes Darrick Grinder, PA-C  cephALEXin (KEFLEX) 500 MG capsule Take 1 capsule (500 mg total) by mouth 4 (four) times daily. 07/11/22   Renne Crigler, PA-C  naproxen (NAPROSYN) 500 MG tablet Take 1 tablet (500 mg total) by mouth 2 (two) times daily. 06/21/19   Wallis Bamberg, PA-C  traZODone (DESYREL) 50 MG tablet Take 1 tablet (50 mg total) by mouth at bedtime as needed for sleep. 07/10/17 06/21/19  Sanjuana Kava, NP      Allergies    Patient has no known allergies.    Review of Systems   Review of Systems  Physical Exam Updated Vital Signs BP 136/87 (BP Location: Right Arm)   Pulse 79   Temp 98.2 F (36.8 C) (Oral)   Resp 20   SpO2 100%  Physical Exam HENT:     Head: Normocephalic and atraumatic.     Mouth/Throat:     Mouth: Mucous membranes are moist.  Eyes:     Conjunctiva/sclera: Conjunctivae normal.  Cardiovascular:     Rate and Rhythm: Normal rate.  Pulmonary:     Effort: Pulmonary  effort is normal. No respiratory distress.  Musculoskeletal:        General: Swelling, tenderness and signs of injury present. No deformity.     Cervical back: Normal range of motion.     Comments: Patient unable to move left knee through full range of motion due to swelling and pain.  Diffuse swelling, worse on the lateral side and noted around the left knee.  No erythema or warmth noted.  Patient is neurovascularly intact distal to the injury.  Skin:    General: Skin is dry.  Neurological:     Mental Status: He is alert.  Psychiatric:        Speech: Speech normal.        Behavior: Behavior normal.     ED Results / Procedures / Treatments   Labs (all labs ordered are listed, but only abnormal results are displayed) Labs Reviewed - No data to display  EKG None  Radiology DG Knee Complete 4 Views Left  Result Date: 01/31/2023 CLINICAL DATA:  Knee injury while playing basketball last night. Left knee pain and swelling. EXAM: LEFT KNEE - COMPLETE 4+ VIEW COMPARISON:  None Available. FINDINGS: No evidence of fracture or dislocation. A large knee joint effusion is seen. No evidence of arthropathy or other focal bone abnormality. Soft tissues are unremarkable. IMPRESSION: Large knee joint effusion.  No evidence of fracture. Electronically Signed   By: Danae Orleans M.D.   On: 01/31/2023 10:26    Procedures .Ortho Injury Treatment  Date/Time: 01/31/2023 11:24 AM  Performed by: Darrick Grinder, PA-C Authorized by: Darrick Grinder, PA-C   Consent:    Consent obtained:  Verbal   Consent given by:  Patient   Risks discussed:  Stiffness, restricted joint movement, vascular damage and recurrent dislocation   Alternatives discussed:  No treatmentInjury location: knee Location details: left knee Injury type: soft tissue Pre-procedure neurovascular assessment: neurovascularly intact Immobilization: brace (Knee immobilizer) Splint Applied by: Milon Dikes Post-procedure neurovascular  assessment: post-procedure neurovascularly intact       Medications Ordered in ED Medications  ketorolac (TORADOL) 15 MG/ML injection 15 mg (15 mg Intramuscular Given 01/31/23 1048)    ED Course/ Medical Decision Making/ A&P                             Medical Decision Making Amount and/or Complexity of Data Reviewed Radiology: ordered.  Risk Prescription drug management.   This patient presents to the ED for concern of knee pain, this involves an extensive number of treatment options, and is a complaint that carries with it a high risk of complications and morbidity.  The differential diagnosis includes fracture, dislocation, soft tissue injury, others   Co morbidities that complicate the patient evaluation  None    Imaging Studies ordered:  I ordered imaging studies including plain x-rays of the left knee I independently visualized and interpreted imaging which showed joint effusion but no fracture or dislocation I agree with the radiologist interpretation   Problem List / ED Course / Critical interventions / Medication management   I ordered medication including Toradol for inflammation Reevaluation of the patient after these medicines showed that the patient improved I have reviewed the patients home medicines and have made adjustments as needed   Social Determinants of Health:  Patient has Medicaid   Test / Admission - Considered:  Patient with no dislocation, fracture on imaging.  Based on patient's history I do suspect that he had a patellar dislocation last night.  Patient placed in knee immobilizer with recommendations for follow-up with orthopedics.  Plan to prescribe meloxicam for inflammation.  Patient may continue to take Tylenol as needed.        Final Clinical Impression(s) / ED Diagnoses Final diagnoses:  Acute pain of left knee    Rx / DC Orders ED Discharge Orders          Ordered    meloxicam (MOBIC) 15 MG tablet  Daily         01/31/23 1130              Pamala Duffel 01/31/23 1131    [place], [place] K, DO 01/31/23 1546

## 2023-01-31 NOTE — ED Triage Notes (Signed)
Patient complains of left knee pain after twisting while playing basketball last night. Pain with any rom and swelling noted

## 2023-01-31 NOTE — Discharge Instructions (Addendum)
You were evaluated today for knee pain.  Please use the immobilizer and crutches to reduce weightbearing on the left lower extremity until follow-up with orthopedics.  Please call the attached number to schedule follow-up appointment.  I have prescribed meloxicam for inflammation.  Do not take other NSAID medications while taking the meloxicam.  This would include no additional ibuprofen or naproxen.  You may continue to take Tylenol as needed.

## 2023-02-04 ENCOUNTER — Ambulatory Visit (INDEPENDENT_AMBULATORY_CARE_PROVIDER_SITE_OTHER): Payer: Medicaid Other | Admitting: Physician Assistant

## 2023-02-04 ENCOUNTER — Encounter: Payer: Self-pay | Admitting: Physician Assistant

## 2023-02-04 DIAGNOSIS — M2500 Hemarthrosis, unspecified joint: Secondary | ICD-10-CM | POA: Diagnosis not present

## 2023-02-04 DIAGNOSIS — S83005A Unspecified dislocation of left patella, initial encounter: Secondary | ICD-10-CM

## 2023-02-04 DIAGNOSIS — M25562 Pain in left knee: Secondary | ICD-10-CM

## 2023-02-04 DIAGNOSIS — S83006A Unspecified dislocation of unspecified patella, initial encounter: Secondary | ICD-10-CM

## 2023-02-04 MED ORDER — LIDOCAINE HCL 1 % IJ SOLN
3.0000 mL | INTRAMUSCULAR | Status: AC | PRN
Start: 2023-02-04 — End: 2023-02-04
  Administered 2023-02-04: 3 mL

## 2023-02-04 NOTE — Progress Notes (Signed)
Office Visit Note   Patient: Seth Nolan           Date of Birth: 03-09-1986           MRN: [id number] Visit Date: 02/04/2023              Requested by: No referring provider defined for this encounter. PCP: Patient, No Pcp Per   Assessment & Plan: Visit Diagnoses:  1. Acute pain of left knee   2. Patellar dislocation, initial encounter     Plan: Patient is a pleasant 37 year old gentleman who is approximately 1 week weeks status post patella dislocation on the left knee.  He states he was playing basketball and came down on someone's foot.  He notices kneecap dislocated laterally.  He did have to reduce this himself.  He did go to the emergency room where x-rays did not demonstrate any osseous injury.  He does not think he has had a history of this in the past.  He has been placed in a knee immobilizer.  I reviewed the x-rays which also showed a large effusion.  He did have a hemarthrosis and I did aspirate 35 cc of frank blood from his knee.  I would like for him to get an MRI of his left knee.  If there is no osteochondral injuries could consider physical therapy as this is his first dislocation.  If there is any concerns on the MRI would refer him to Dr. August Saucer.  I encouraged him to do ankle flexion and extension and gentle motion of the knee.  Follow-Up Instructions: Return Will call after MRI.   Orders:  Orders Placed This Encounter  Procedures   MR Knee Left w/o contrast   No orders of the defined types were placed in this encounter.     Procedures: Large Joint Inj: L knee on 02/04/2023 10:26 AM Indications: pain and diagnostic evaluation Details: 25 G 1.5 in needle  Arthrogram: No  Medications: 3 mL lidocaine 1 % Aspirate: 35 mL bloody Outcome: tolerated well, no immediate complications  After obtaining verbal consent Superior lateral patella was prepped with alcohol and then Betadine x 2.  3 cc of lidocaine plain was injected after adequate anesthesia was obtained  an 18-gauge needle was inserted.  35 cc of bloody fluid was aspirated.  Band-Aid was applied and Ace wrap was placed he tolerated the procedure well Procedure, treatment alternatives, risks and benefits explained, specific risks discussed. Consent was given by the patient.     Clinical Data: No additional findings.   Subjective: Chief Complaint  Patient presents with   Left Knee - Pain    HPI  Review of Systems  All other systems reviewed and are negative.    Objective: Vital Signs: There were no vitals taken for this visit.  Physical Exam Constitutional:      Appearance: Normal appearance.  Pulmonary:     Effort: Pulmonary effort is normal.  Skin:    General: Skin is warm and dry.  Neurological:     General: No focal deficit present.     Mental Status: He is alert.     Ortho Exam Examination of his left knee.  He has no redness he does have an obvious effusion.  Compartments are soft and nontender he is able to flex and extend his ankle without difficulty.  He does not have any pain around the joint line.  He does have apprehension sign.  Can actively extend his leg against resistance.  Is  able to sustain a straight leg raise.  Patella tendon quadricep complex appears intact. Specialty Comments:  No specialty comments available.  Imaging: No results found.   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 Social History   Occupational History   Not on file  Tobacco Use   Smoking status: Every Day    Packs/day: .25    Types: Cigarettes    Smokeless tobacco: Never  Substance and Sexual Activity   Alcohol use: Yes    Alcohol/week: 2.0 standard drinks of alcohol    Types: 2 Cans of beer per week   Drug use: Yes    Frequency: 7.0 times per week    Types: Marijuana    Comment: pt states whenever he is bored/daily   Sexual activity: Yes

## 2023-02-07 ENCOUNTER — Ambulatory Visit
Admission: RE | Admit: 2023-02-07 | Discharge: 2023-02-07 | Disposition: A | Discharge status: Home / Self Care | Payer: Self-pay | Source: Ambulatory Visit | Attending: Physician Assistant | Admitting: Physician Assistant

## 2023-02-07 DIAGNOSIS — M25562 Pain in left knee: Secondary | ICD-10-CM

## 2023-02-25 ENCOUNTER — Ambulatory Visit: Payer: Medicaid Other | Admitting: Physician Assistant

## 2023-02-25 ENCOUNTER — Encounter: Payer: Self-pay | Admitting: Physician Assistant

## 2023-02-25 DIAGNOSIS — M25562 Pain in left knee: Secondary | ICD-10-CM

## 2023-02-25 NOTE — Progress Notes (Signed)
Office Visit Note   Patient: Seth Nolan           Date of Birth: Aug 09, 1986           MRN: [id number] Visit Date: 02/25/2023              Requested by: No referring provider defined for this encounter. PCP: Patient, No Pcp Per  Chief Complaint  Patient presents with   Left Knee - Follow-up      HPI: Mr. Seth Nolan is a pleasant 37 year old gentleman who presents in follow-up today.  He is approximately 4 weeks status post dislocation of his patella on his left knee.  This is the first time he has done this.  He had been placed in a knee immobilizer and MRI was ordered as he did have significant hemarthrosis.  He comes in today feeling better not wearing the immobilizer.  Rates his pain as mild  Assessment & Plan: Visit Diagnoses:  1. Left knee pain, unspecified chronicity     Plan: Went over the MRI with him and spoke with Dr. Roda Shutters.  MRI demonstrates a transient finding is consistent with a lateral dislocation of the patella with complete tear of the MPFL from the patella and a partial thickness cartilage defect at the patellar apex.  He does have a shallow trochlear groove and patella alta.  As this is only his first time dislocating we will place him in a stabilization brace and refer him to physical therapy.  Should follow-up with me in 6 weeks.  He understands the most important thing at this point is not to have another dislocation of his patella prior to fully rehabbing it.  Should stay away from at risk activities and wear the brace when he is out and about  Follow-Up Instructions: No follow-ups on file.   Ortho Exam  Patient is alert, oriented, no adenopathy, well-dressed, normal affect, normal respiratory effort. Examination of his left knee mild effusion no warmth no erythema.  Patella is reduced.  Has full extension of his leg flexes to 90 degrees minimal tenderness to palpation  Imaging: No results found. No images are attached to the encounter.  Labs: No results  found for: "HGBA1C", "ESRSEDRATE", "CRP", "LABURIC", "REPTSTATUS", "GRAMSTAIN", "CULT", "LABORGA"   Lab Results  Component Value Date   ALBUMIN 4.5 07/04/2017   ALBUMIN 4.6 08/17/2016    No results found for: "MG" No results found for: "VD25OH"  No results found for: "PREALBUMIN"    Latest Ref Rng & Units 10/19/2020    4:05 PM 07/04/2017    7:58 PM 08/17/2016   12:05 AM  CBC EXTENDED  WBC 4.0 - 10.5 K/uL 2.6  5.1  4.4   RBC 4.22 - 5.81 MIL/uL 4.62  4.48  4.51   Hemoglobin 13.0 - 17.0 g/dL 40.9  81.1  91.4   HCT 39.0 - 52.0 % 43.5  41.8  41.3   Platelets 150 - 400 K/uL 252  318  349      There is no height or weight on file to calculate BMI.  Orders:  Orders Placed This Encounter  Procedures   Ambulatory referral to Physical Therapy   No orders of the defined types were placed in this encounter.    Procedures: No procedures performed  Clinical Data: No additional findings.  ROS:  All other systems negative, except as noted in the HPI. Review of Systems  Objective: Vital Signs: There were no vitals taken for this visit.  Specialty Comments:  No specialty comments available.  PMFS History: Patient Active Problem List   Diagnosis Date Noted   Patellar dislocation, initial encounter 02/04/2023   Substance induced mood disorder (HCC) 07/07/2017   MDD (major depressive disorder), recurrent, severe, with psychosis (HCC) 07/06/2017   Past Medical History:  Diagnosis Date   Acute alcoholic gastritis without hemorrhage 08/2016   ETOH abuse    Marijuana abuse     Family History  Family history unknown: Yes    No past surgical history on file. Social History   Occupational History   Not on file  Tobacco Use   Smoking status: Every Day    Packs/day: .25    Types: Cigarettes   Smokeless tobacco: Never  Substance and Sexual Activity   Alcohol use: Yes    Alcohol/week: 2.0 standard drinks of alcohol    Types: 2 Cans of beer per week   Drug use: Yes     Frequency: 7.0 times per week    Types: Marijuana    Comment: pt states whenever he is bored/daily   Sexual activity: Yes

## 2023-03-06 ENCOUNTER — Ambulatory Visit: Payer: Medicaid Other | Admitting: Physical Therapy

## 2023-03-09 NOTE — Therapy (Unsigned)
OUTPATIENT PHYSICAL THERAPY LOWER EXTREMITY EVALUATION   Patient Name: Seth Nolan MRN: [id number] DOB:04/10/1986, 37 y.o., male Today's Date: 03/10/2023  END OF SESSION:  PT End of Session - 03/10/23 0930     Visit Number 1    Number of Visits 8    Date for PT Re-Evaluation 05/05/23    Authorization Type [facility] [employer]    PT Start Time 0932    PT Stop Time 1010    PT Time Calculation (min) 38 min    Activity Tolerance Patient tolerated treatment well;No increased pain    Behavior During Therapy [facility] for tasks assessed/performed             Past Medical History:  Diagnosis Date   Acute alcoholic gastritis without hemorrhage 08/2016   ETOH abuse    Marijuana abuse    History reviewed. No pertinent surgical history. Patient Active Problem List   Diagnosis Date Noted   Patellar dislocation, initial encounter 02/04/2023   Substance induced mood disorder (HCC) 07/07/2017   MDD (major depressive disorder), recurrent, severe, with psychosis (HCC) 07/06/2017    PCP: unknown   REFERRING PROVIDER: Persons, [place], [place]  REFERRING DIAG: [phone] (ICD-10-CM) - Left knee pain, unspecified chronicity  THERAPY DIAG:  Left knee pain, unspecified chronicity  Rationale for Evaluation and Treatment: Rehabilitation  ONSET DATE: 01/31/23  SUBJECTIVE:   SUBJECTIVE STATEMENT: Pt dislocated his patella while playing basketball about 4 weeks ago. He had been placed in a knee immobilizer and MRI was ordered as he did have significant hemarthrosis.  He continues to have some tightness, stiffness when he sleeping, lying down and getting up initially. With the brace off he is limited in how much he can bend it. He denies sensory changes, weakness or swelling.  He is not doing his normal activity (basketball) due to this. 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   PERTINENT HISTORY: none  PAIN:  Are you having pain? Yes: NPRS scale: 0/10 Pain  location: L lateral knee Pain description: tightness Aggravating factors: Prolonged positioning Relieving factors: Stretching it out Pain has not been an issue since the initial injury.    PRECAUTIONS: Other: brace when out and about     WEIGHT BEARING RESTRICTIONS: No No longer uses crutches  FALLS:  Has patient fallen in last 6 months? No  LIVING ENVIRONMENT: Lives with: lives with their family Lives in: House/apartment Stairs: Yes: Internal: 12+ steps; on right going up Has following equipment at home: None  OCCUPATION: unemployed  PLOF: Independent  PATIENT GOALS: I want to get my knee back stronger and get of this brace.   NEXT MD VISIT: 6 weeks   OBJECTIVE:   DIAGNOSTIC FINDINGS:  MRI  1. Findings consistent with transient lateral dislocation of the patella with complete tear of the MPFL from the patella and a partial-thickness cartilage defect at the patellar apex. The femoral attachment of the MPFL is difficult to evaluate due to extensive surrounding edema but appears to be intact. 2. Shallow trochlear groove and patella alta predispose to dislocation. TT-TG distance is 2 cm.  PATIENT SURVEYS:  FOTO 83%  COGNITION: Overall cognitive status: Within functional limits for tasks assessed     SENSATION: WFL  EDEMA:  Circumferential: Rt 15 inch, Lt 15.25 inch   MUSCLE LENGTH: Hamstrings: WFL Thomas test: NT  POSTURE:  genu recurvatum   PALPATION: Non- tender to palpation along medial joint line and peripatellar Patella moves freely in all directions  LOWER EXTREMITY ROM:  Active ROM Right eval Left eval  Hip flexion    Hip extension    Hip abduction    Hip adduction    Hip internal rotation    Hip external rotation    Knee flexion 144 130  Knee extension +10 +9  Ankle dorsiflexion    Ankle plantarflexion    Ankle inversion    Ankle eversion     (Blank rows = not tested)  LOWER EXTREMITY MMT:  MMT Right eval Left eval  Hip  flexion 4+ 4+  Hip extension    Hip abduction 4+ 5  Hip adduction    Hip internal rotation    Hip external rotation    Knee flexion 5 4+  Knee extension 5 5  Ankle dorsiflexion    Ankle plantarflexion    Ankle inversion    Ankle eversion     (Blank rows = not tested)  LOWER EXTREMITY SPECIAL TESTS:  NT  FUNCTIONAL TESTS:  5 times sit to stand: 12.8 sec  SLS: more wobbly on LLE , Rt LE WNL   Step up [facility] cues to avoid locking knee   Step down 6 inches , unsteady, fatigue noted but not painful  GAIT: Distance walked: 150 Assistive device utilized: None Level of assistance: Complete Independence Comments: wearing brace    TODAY'S TREATMENT:                                                                                                                              DATE: 03/10/23    PATIENT EDUCATION:  Education details: PT, POC, anatomy, balance and stability, knee control  Person educated: Patient Education method: Explanation, Demonstration, and Handouts Education comprehension: verbalized understanding and returned demonstration  HOME EXERCISE PROGRAM: Access Code: [email or web address] URL: [email or web address] Date: 03/10/2023 Prepared by: Karie Mainland  Exercises - Supine Active Straight Leg Raise  - 2 x daily - 7 x weekly - 3 sets - 10 reps - 5 hold - Straight Leg Raise with External Rotation  - 2 x daily - 7 x weekly - 3 sets - 10 reps - 5 hold - Sidelying Hip Abduction  - 3 x daily - 7 x weekly - 3 sets - 10 reps - 5 hold - Supine ITB Stretch  - 1 x daily - 7 x weekly - 1 sets - 5 reps - 30 hold - Single Leg Stance  - 1-3 x daily - 7 x weekly - 1 sets - 5 reps - 30 hold  ASSESSMENT:  CLINICAL IMPRESSION: Patient is a 37 y.o. male who was seen today for physical therapy evaluation and treatment for Patellar dislocation with complete tear of MPFL.   OBJECTIVE IMPAIRMENTS: decreased mobility, decreased ROM, decreased strength, increased edema, impaired  flexibility, and pain.   ACTIVITY LIMITATIONS: standing, squatting, sleeping, and locomotion level  PARTICIPATION LIMITATIONS: community activity  PERSONAL FACTORS: Behavior pattern and 1 comorbidity: predisposed to dislocation due to anatomy  are also affecting patient's functional outcome.   REHAB POTENTIAL: Excellent  CLINICAL DECISION MAKING: Stable/uncomplicated  EVALUATION COMPLEXITY: Low   GOALS: Goals reviewed with patient? Yes  SHORT TERM GOALS: Target date: 03/31/2023   Pt will be I with HEP for L knee ROM, strength and stability  Baseline:unknown, given today  Goal status: INITIAL  2.  Pt will be able to increase steps per day, activity overall without increased pain  Baseline: has been less mobile and active after the injury  Goal status: INITIAL   LONG TERM GOALS: Target date: 05/05/2023    Pt will be I with final HEP upon discharge  Baseline: unknown  Goal status: INITIAL  2.  Pt will be able to perform single leg dynamic balance activities without knee pain, good stability  Baseline: limited static balance  Goal status: INITIAL  3.  Pt will be able to report no pain or discomfort with when getting up, stretching in AM  Baseline: min to mod tightness ant knee  Goal status: INITIAL  4.  Pt will be able to tolerate light plyometrics, if allowed by MD without increased knee pain  Baseline: not advised at this time  Goal status: INITIAL   PLAN:  PT FREQUENCY: 1x/week  PT DURATION: 8 weeks  PLANNED INTERVENTIONS: Therapeutic exercises, Therapeutic activity, Neuromuscular re-education, Balance training, Gait training, Patient/Family education, Self Care, and Joint mobilization  PLAN FOR NEXT SESSION: check HEP .  Closed chain strength and stability as tol. Progress,  pt has elliptical at weights at home.    Maylen Waltermire, PT 03/10/2023, 12:04 PM   Check all possible CPT codes: 16109 - PT Re-evaluation, 97110- Therapeutic Exercise, [phone]- Neuro  Re-education, 97140 - Manual Therapy, 97530 - Therapeutic Activities, [phone] - Self Care, and [phone] - Physical performance training    Check all conditions that are expected to impact treatment: {Conditions expected to impact treatment:None of these apply   If treatment provided at initial evaluation, no treatment charged due to lack of authorization.   Karie Mainland, PT 03/10/23 12:29 PM Phone: [phone] Fax: [phone]

## 2023-03-10 ENCOUNTER — Encounter: Payer: Self-pay | Admitting: Physical Therapy

## 2023-03-10 ENCOUNTER — Ambulatory Visit: Payer: Medicaid Other | Attending: Physician Assistant | Admitting: Physical Therapy

## 2023-03-10 DIAGNOSIS — M25562 Pain in left knee: Secondary | ICD-10-CM | POA: Insufficient documentation

## 2023-03-17 ENCOUNTER — Encounter: Payer: Self-pay | Admitting: Physical Therapy

## 2023-03-17 ENCOUNTER — Ambulatory Visit: Payer: Medicaid Other | Attending: Physician Assistant | Admitting: Physical Therapy

## 2023-03-17 DIAGNOSIS — M25562 Pain in left knee: Secondary | ICD-10-CM | POA: Insufficient documentation

## 2023-03-17 NOTE — Therapy (Addendum)
OUTPATIENT PHYSICAL THERAPY TREATMENT NOTE DISCHARGE    PHYSICAL THERAPY DISCHARGE SUMMARY  Visits from Start of Care: 2  Current functional level related to goals / functional outcomes: See below    Remaining deficits: Unknown did not return to PT    Education / Equipment: HEP    Patient agrees to discharge. Patient goals were not met. Patient is being discharged due to not returning since the last visit.  Karie Mainland, PT 05/04/23 11:01 AM Phone: [phone] Fax: [phone]     Patient Name: Seth Nolan MRN: [id number] DOB:1986/03/12, 37 y.o., male Today's Date: 03/17/2023  PCP: none  REFERRING PROVIDER: Persons, [place], [place]  END OF SESSION:   PT End of Session - 03/17/23 1101     Visit Number 2    Number of Visits 8    Date for PT Re-Evaluation 05/05/23    Authorization Type [place] [employer]    Authorization Time Period 03/16/23- 04/14/23    Authorization - Visit Number 1    Authorization - Number of Visits 4    PT Start Time 1102    PT Stop Time 1145    PT Time Calculation (min) 43 min    Activity Tolerance Patient tolerated treatment well;No increased pain    Behavior During Therapy [facility] for tasks assessed/performed             Past Medical History:  Diagnosis Date   Acute alcoholic gastritis without hemorrhage 08/2016   ETOH abuse    Marijuana abuse    History reviewed. No pertinent surgical history. 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 He is approximately 4 weeks status post dislocation  of his patella on his left knee. This is the first time he has done this. He had been placed in a knee immobilizer and MRI was ordered as he did have significant hemarthrosis. He comes in today feeling better not wearing the immobilizer. Rates his pain as mild  PRECAUTIONS: none   SUBJECTIVE:                                                                                                                                                                                      SUBJECTIVE STATEMENT:  Doing good.  It doesn't hurt at night  now, only when I try to cross it over.    PAIN:  Are you having pain? Yes: NPRS scale: NONE/10 Pain location: Lt. Knee  Pain description: tight  Aggravating factors: crossing it Relieving factors: brace, rest    OBJECTIVE: (objective measures completed at initial evaluation unless otherwise dated)  DIAGNOSTIC FINDINGS:  MRI  1. Findings consistent with transient lateral dislocation of the patella with complete tear of the MPFL from the patella and a partial-thickness cartilage defect at the patellar apex. The femoral attachment of the MPFL is difficult to evaluate due to extensive surrounding edema but appears to be intact. 2. Shallow trochlear groove and patella alta predispose to dislocation. TT-TG distance is 2 cm.   PATIENT SURVEYS:  FOTO 83%   COGNITION: Overall cognitive status: Within functional limits for tasks assessed                         SENSATION: WFL   EDEMA:  Circumferential: Rt 15 inch, Lt 15.25 inch    MUSCLE LENGTH: Hamstrings: WFL Thomas test: NT   POSTURE:  genu recurvatum    PALPATION: Non- tender to palpation along medial joint line and peripatellar Patella moves freely in all directions   LOWER EXTREMITY ROM:   Active ROM Right eval Left eval  Hip flexion      Hip extension      Hip abduction      Hip adduction      Hip internal rotation      Hip external rotation      Knee flexion 144 130  Knee  extension +10 +9  Ankle dorsiflexion      Ankle plantarflexion      Ankle inversion      Ankle eversion       (Blank rows = not tested)   LOWER EXTREMITY MMT:   MMT Right eval Left eval  Hip flexion 4+ 4+  Hip extension      Hip abduction 4+ 5  Hip adduction      Hip internal rotation      Hip external rotation      Knee flexion 5 4+  Knee extension 5 5  Ankle dorsiflexion      Ankle plantarflexion      Ankle inversion      Ankle eversion       (Blank rows = not tested)   LOWER EXTREMITY SPECIAL TESTS:  NT   FUNCTIONAL TESTS:  5 times sit to stand: 12.8 sec  SLS: more wobbly on LLE , Rt LE WNL             Step up [facility] cues to avoid locking knee             Step down 6 inches , unsteady, fatigue noted but not painful  GAIT: Distance walked: 150 Assistive device utilized: None Level of assistance: Complete Independence Comments: wearing brace      TODAY'S TREATMENT:  DATEMarlane Mingle Adult PT Treatment:                                                DATE: 03/17/23 Therapeutic Exercise: Recumbent bike L 2, 5 min  Supine Active Straight Leg Raise 2 x 15 Straight Leg Raise with External Rotation 2 x 15 Side lying Hip Abduction x  2 x 15  Supine ITB Stretch  x 30 sec  Banded knee extension green band x 30  bridge with ball squeeze x 15  Single Leg Stance  static, on foam pad  added challenge with 10 lbs KB pass  Step back 6 inch toe tap Lateral step over Step down 4 inch forward and lateral (8 inch) x 15  Single leg RDL KB place inside and outside L foot  Airex pad tandem stance EO, EC and head turns each foot forward Modalities: None   03/10/23 PT eval.      PATIENT EDUCATION:  Education details: PT, POC, anatomy, balance and stability, knee control  Person educated: Patient Education method: Explanation, Demonstration, and  Handouts Education comprehension: verbalized understanding and returned demonstration   HOME EXERCISE PROGRAM: Access Code: [email or web address] URL: [email or web address] Date: 03/10/2023 Prepared by: Karie Mainland   Exercises - Supine Active Straight Leg Raise  - 2 x daily - 7 x weekly - 3 sets - 10 reps - 5 hold - Straight Leg Raise with External Rotation  - 2 x daily - 7 x weekly - 3 sets - 10 reps - 5 hold - Sidelying Hip Abduction  - 3 x daily - 7 x weekly - 3 sets - 10 reps - 5 hold - Supine ITB Stretch  - 1 x daily - 7 x weekly - 1 sets - 5 reps - 30 hold - Single Leg Stance  - 1-3 x daily - 7 x weekly - 1 sets - 5 reps - 30 hold - added uneven surfaces   ASSESSMENT:   CLINICAL IMPRESSION: Patient was very challenged with standing single leg work on each leg.  Modified to make the exercises more successful.  Explained to him that this was normal given the tear in his medial ligament.  No new exercises given but did recommend he try standing on grass or a folded towel to increase the challenge at home. No pain during exercises.     OBJECTIVE IMPAIRMENTS: decreased mobility, decreased ROM, decreased strength, increased edema, impaired flexibility, and pain.    ACTIVITY LIMITATIONS: standing, squatting, sleeping, and locomotion level   PARTICIPATION LIMITATIONS: community activity   PERSONAL FACTORS: Behavior pattern and 1 comorbidity: predisposed to dislocation due to anatomy   are also affecting patient's functional outcome.    REHAB POTENTIAL: Excellent   CLINICAL DECISION MAKING: Stable/uncomplicated   EVALUATION COMPLEXITY: Low     GOALS: Goals reviewed with patient? Yes   SHORT TERM GOALS: Target date: 03/31/2023   Pt will be I with HEP for L knee ROM, strength and stability  Baseline:unknown, given today  Goal status: INITIAL   2.  Pt will be able to increase steps per day, activity overall without increased pain  Baseline: has been less mobile and active  after the injury  Goal status: INITIAL     LONG TERM GOALS: Target date: 05/05/2023     Pt will be I with final HEP upon discharge  Baseline: unknown  Goal status: INITIAL   2.  Pt will be able to perform single leg dynamic balance activities without knee pain, good stability  Baseline: limited static balance  Goal status: INITIAL   3.  Pt will be able to report no pain or discomfort with when getting up, stretching in AM  Baseline: min to mod tightness ant knee  Goal status: INITIAL   4.  Pt will be able to tolerate light plyometrics, if allowed by MD without increased knee pain  Baseline: not advised at this time  Goal status: INITIAL     PLAN:   PT FREQUENCY: 1x/week   PT DURATION: 8 weeks   PLANNED INTERVENTIONS: Therapeutic exercises, Therapeutic activity, Neuromuscular re-education, Balance training, Gait training, Patient/Family education, Self Care, and Joint mobilization   PLAN FOR NEXT SESSION: check HEP .  Closed chain strength and stability as tol. Progress,  pt has elliptical at weights at home.      Teaghan Formica, PT 03/10/2023, 12:04 PM    Check all possible CPT codes: 24401 - PT Re-evaluation, 97110- Therapeutic Exercise, [phone]- Neuro Re-education, 97140 - Manual Therapy, 97530 - Therapeutic Activities, [phone] - Self Care, and [phone] - Physical performance training                           Check all conditions that are expected to impact treatment: {Conditions expected to impact treatment:None of these apply             If treatment provided at initial evaluation, no treatment charged due to lack of authorization.    Karie Mainland, PT 03/10/23 12:29 PM Phone: [phone] Fax: [phone]                  (  Nichlas Pitera, PT 03/17/2023, 11:53 AM

## 2023-03-24 ENCOUNTER — Ambulatory Visit: Payer: Medicaid Other | Admitting: Physical Therapy

## 2023-04-01 ENCOUNTER — Ambulatory Visit: Payer: Medicaid Other | Admitting: Physical Therapy

## 2023-04-02 ENCOUNTER — Encounter: Payer: Medicaid Other | Admitting: Physical Therapy

## 2023-04-07 ENCOUNTER — Ambulatory Visit: Payer: Medicaid Other | Admitting: Physical Therapy

## 2023-04-08 ENCOUNTER — Telehealth: Payer: Self-pay | Admitting: Physical Therapy

## 2023-04-08 ENCOUNTER — Ambulatory Visit: Payer: Medicaid Other | Admitting: Physical Therapy

## 2023-04-08 NOTE — Telephone Encounter (Signed)
Attempted to contact pt d/t missed visit, but no answer or VM set up.  Canceling remaining appt as pt has canceled last 4 appts and n/s'd today's visit.  Can schedule another visit if he calls back and would like to.

## 2023-04-14 ENCOUNTER — Ambulatory Visit: Payer: Medicaid Other | Admitting: Physical Therapy

## 2023-04-28 ENCOUNTER — Encounter: Payer: Medicaid Other | Admitting: Physical Therapy

## 2024-05-22 ENCOUNTER — Ambulatory Visit (HOSPITAL_COMMUNITY)
Admission: EM | Admit: 2024-05-22 | Discharge: 2024-05-22 | Disposition: A | Discharge status: Home / Self Care | Attending: Nurse Practitioner | Admitting: Nurse Practitioner

## 2024-05-22 ENCOUNTER — Encounter (HOSPITAL_COMMUNITY): Payer: Self-pay | Admitting: Emergency Medicine

## 2024-05-22 DIAGNOSIS — S81811A Laceration without foreign body, right lower leg, initial encounter: Secondary | ICD-10-CM | POA: Diagnosis not present

## 2024-05-22 MED ORDER — LIDOCAINE HCL 2 % IJ SOLN
INTRAMUSCULAR | Status: AC
  Filled 2024-05-22: qty 20

## 2024-05-22 MED ORDER — TETANUS-DIPHTH-ACELL PERTUSSIS 5-2.5-18.5 LF-MCG/0.5 IM SUSY: 0.5000 mL | PREFILLED_SYRINGE | Freq: Once | INTRAMUSCULAR | Status: DC

## 2024-05-22 NOTE — Discharge Instructions (Signed)
 You were seen today for a cut which is also known as a laceration.  A laceration is a type of injury that can go through all layers of the skin and sometimes into the tissue underneath. Some cuts can heal on their own, but others need to be closed to help them heal properly and reduce the risk of infection.  Your cut was treated with stitches. Keep the dressing on for the next 24 hours and do not let it get wet. After 24 hours, you can remove the dressing. Clean the area gently once a day using warm water and antibacterial soap. After cleaning, pat the area dry with a clean towel. Do not rub the wound, scratch it, or pick at it. Avoid using disinfectants, antiseptics, ointments, creams, or liquid medicines on the wound.  Keep the area covered with a non-stick dressing for the next couple of days. If you're at home and not doing anything that could get the wound dirty, you can leave it uncovered, as air helps promote healing.  Please return here in 10 days to have the stitches removed. If you notice increased redness, swelling, pus, or worsening pain, contact your primary care provider or return here right away.

## 2024-05-22 NOTE — ED Triage Notes (Signed)
 Patient has a laceration on the right leg that happen yesterday around 11pm.  Laceration want stop bleeding.  Patient has not taken anything for pain

## 2024-05-22 NOTE — ED Provider Notes (Signed)
 [facility]    CSN: [id number] Arrival date & time: 05/22/24  1127      History   Chief Complaint Chief Complaint  Patient presents with   Laceration    HPI Seth Nolan is a 38 y.o. male.   Discussed the use of AI scribe software for clinical note transcription with the patient, who gave verbal consent to proceed.   The patient presents with a laceration on his leg. The injury occurred last night around 11 pm. Patient reports hitting his shin on the brick deck causing it to split open. The patient denies any numbness or tingling in the affected area. The patient's last tetanus shot was 06/2022. No other associated symptoms reported.   The following portions of the patient's history were reviewed and updated as appropriate: allergies, current medications, past family history, past medical history, past social history, past surgical history, and problem list.    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 No pertinent surgical history.     Home Medications    Prior to Admission medications   Medication Sig Start Date End Date Taking? Authorizing Provider  traZODone  (DESYREL ) 50 MG tablet Take 1 tablet (50 mg total) by mouth at bedtime as needed for sleep. 07/10/17 06/21/19  Collene Mac FERNS, NP    Family History Family History  Family history unknown: Yes    Social History Social History   Tobacco Use   Smoking status: Every Day    Current packs/day: 0.25    Types: Cigarettes   Smokeless tobacco: Never  Vaping Use   Vaping status: Never Used  Substance Use Topics   Alcohol use: Yes    Alcohol/week:  2.0 standard drinks of alcohol    Types: 2 Cans of beer per week   Drug use: Yes    Frequency: 7.0 times per week    Types: Marijuana    Comment: pt states whenever he is bored/daily     Allergies   Patient has no known allergies.   Review of Systems Review of Systems  Musculoskeletal:  Negative for arthralgias and gait problem.  Skin:  Positive for wound.  Neurological:  Negative for weakness and numbness.  All other systems reviewed and are negative.    Physical Exam Triage Vital Signs ED Triage Vitals  Encounter Vitals Group     BP 05/22/24 1200 130/75     Girls Systolic BP Percentile --      Girls Diastolic BP Percentile --      Boys Systolic BP Percentile --      Boys Diastolic BP Percentile --      Pulse Rate 05/22/24 1200 75     Resp 05/22/24 1200 16     Temp 05/22/24 1200 98.3 F (36.8 C)     Temp Source 05/22/24 1200 Oral     SpO2 05/22/24 1200 94 %     Weight --      Height --      Head Circumference --      Peak Flow --      Pain Score 05/22/24 1158 8     Pain Loc --  Pain Education --      Exclude from Growth Chart --    No data found.  Updated Vital Signs BP 130/75 (BP Location: Right Arm)   Pulse 75   Temp 98.3 F (36.8 C) (Oral)   Resp 16   SpO2 94%   Visual Acuity Right Eye Distance:   Left Eye Distance:   Bilateral Distance:    Right Eye Near:   Left Eye Near:    Bilateral Near:     Physical Exam Vitals reviewed.  Constitutional:      General: He is awake. He is not in acute distress.    Appearance: Normal appearance. He is well-developed. He is not ill-appearing, toxic-appearing or diaphoretic.  HENT:     Head: Normocephalic.     Right Ear: Hearing normal.     Left Ear: Hearing normal.     Nose: Nose normal.     Mouth/Throat:     Mouth: Mucous membranes are moist.  Eyes:     General: Vision grossly intact.     Conjunctiva/sclera: Conjunctivae normal.  Cardiovascular:     Rate and Rhythm: Normal rate and regular  rhythm.     Heart sounds: Normal heart sounds.  Pulmonary:     Effort: Pulmonary effort is normal.     Breath sounds: Normal breath sounds and air entry.  Musculoskeletal:        General: Normal range of motion.     Cervical back: Full passive range of motion without pain, normal range of motion and neck supple.  Skin:    General: Skin is warm and dry.     Findings: Laceration present.     Comments: Laceration to the anterior aspect of the right lower leg with ragged edges, measuring approximately 4 cm in length. No surrounding swelling or erythema observed. Scant amount of bleeding present (see picture below)   Neurological:     General: No focal deficit present.     Mental Status: He is alert and oriented to person, place, and time.  Psychiatric:        Speech: Speech normal.        Behavior: Behavior is cooperative.      UC Treatments / Results  Labs (all labs ordered are listed, but only abnormal results are displayed) Labs Reviewed - No data to display  EKG   Radiology No results found.  Procedures Laceration Repair  Date/Time: 05/22/2024 2:03 PM  Performed by: Iola Lukes, FNP Authorized by: Iola Lukes, FNP   Consent:    Consent obtained:  Verbal   Consent given by:  Patient   Risks, benefits, and alternatives were discussed: yes     Risks discussed:  Infection, pain, poor cosmetic result and poor wound healing Universal protocol:    Patient identity confirmed:  Verbally with patient and arm band Anesthesia:    Anesthesia method:  Local infiltration   Local anesthetic:  Lidocaine  1% w/o epi Laceration details:    Location:  Leg   Leg location:  R lower leg   Length (cm):  4 Exploration:    Hemostasis achieved with:  Direct pressure   Contaminated: no   Treatment:    Area cleansed with:  Shur-Clens   Amount of cleaning:  Standard Skin repair:    Repair method:  Sutures   Suture size:  4-0   Suture material:  Prolene   Suture technique:   Simple interrupted   Number of sutures:  9 Approximation:    Approximation:  Close Repair type:    Repair type:  Intermediate Post-procedure details:    Dressing:  Non-adherent dressing   Procedure completion:  Tolerated well, no immediate complications  (including critical care time)  Medications Ordered in UC Medications - No data to display  Initial Impression / Assessment and Plan / UC Course  I have reviewed the triage vital signs and the nursing notes.  Pertinent labs & imaging results that were available during my care of the patient were reviewed by me and considered in my medical decision making (see chart for details).    Patient evaluated for laceration to the right lower leg status post repair with sutures as above.  Examination revealed no evidence of tendon, nerve, or vascular injury. Tdap vaccination up-to-date.  Wound care instructions were reviewed, including keeping the area clean and dry, monitoring for signs of infection, and activity modifications to protect the repair. Patient advised to follow up for suture removal in 10 days and to seek prompt care for increasing pain, swelling, redness, drainage, or fever.  Today's evaluation has revealed no signs of a dangerous process. Discussed diagnosis with patient and/or guardian. Patient and/or guardian aware of their diagnosis, possible red flag symptoms to watch out for and need for close follow up. Patient and/or guardian understands verbal and written discharge instructions. Patient and/or guardian comfortable with plan and disposition.  Patient and/or guardian has a clear mental status at this time, good insight into illness (after discussion and teaching) and has clear judgment to make decisions regarding their care  Documentation was completed with the aid of voice recognition software. Transcription may contain typographical errors. Final Clinical Impressions(s) / UC Diagnoses   Final diagnoses:  Laceration of right  lower extremity, initial encounter     Discharge Instructions      You were seen today for a cut which is also known as a laceration.  A laceration is a type of injury that can go through all layers of the skin and sometimes into the tissue underneath. Some cuts can heal on their own, but others need to be closed to help them heal properly and reduce the risk of infection.  Your cut was treated with stitches. Keep the dressing on for the next 24 hours and do not let it get wet. After 24 hours, you can remove the dressing. Clean the area gently once a day using warm water and antibacterial soap. After cleaning, pat the area dry with a clean towel. Do not rub the wound, scratch it, or pick at it. Avoid using disinfectants, antiseptics, ointments, creams, or liquid medicines on the wound.  Keep the area covered with a non-stick dressing for the next couple of days. If you're at home and not doing anything that could get the wound dirty, you can leave it uncovered, as air helps promote healing.  Please return here in 10 days to have the stitches removed. If you notice increased redness, swelling, pus, or worsening pain, contact your primary care provider or return here right away.     ED Prescriptions   None    PDMP not reviewed this encounter.   Iola Lukes, [place] 05/22/24 1407

## 2024-05-30 ENCOUNTER — Other Ambulatory Visit: Payer: Self-pay

## 2024-05-30 ENCOUNTER — Encounter (HOSPITAL_COMMUNITY): Payer: Self-pay | Admitting: *Deleted

## 2024-05-30 ENCOUNTER — Ambulatory Visit (HOSPITAL_COMMUNITY): Admission: EM | Admit: 2024-05-30 | Discharge: 2024-05-30 | Disposition: A | Discharge status: Home / Self Care

## 2024-05-30 DIAGNOSIS — Z4802 Encounter for removal of sutures: Secondary | ICD-10-CM

## 2024-05-30 NOTE — ED Triage Notes (Signed)
 Suture removal to Rt shin. Placed at [facility] on 05/22/24

## 2024-05-30 NOTE — ED Notes (Signed)
 Skin intact 9 sutures removed. Pt tol. Well.

## 2024-05-30 NOTE — Discharge Instructions (Addendum)
 Please keep the suture area clean and seek medical evaluation if any signs of infection such as swelling, redness, or drainage develop.
